# Patient Record
Sex: Female | Born: 1965 | Race: Black or African American | Hispanic: No | Marital: Single | Smoking: Never smoker
Health system: Southern US, Community
[De-identification: ages and names within clinical notes are randomized; demographics above are authoritative.]

---

## 2004-11-28 ENCOUNTER — Ambulatory Visit: Payer: Self-pay | Admitting: Family Medicine

## 2004-12-19 ENCOUNTER — Ambulatory Visit: Payer: Self-pay | Admitting: Family Medicine

## 2005-02-07 ENCOUNTER — Ambulatory Visit: Payer: Self-pay | Admitting: Family Medicine

## 2005-04-10 ENCOUNTER — Ambulatory Visit: Payer: Self-pay | Admitting: Family Medicine

## 2005-08-21 ENCOUNTER — Ambulatory Visit: Payer: Self-pay | Admitting: Family Medicine

## 2006-01-04 ENCOUNTER — Encounter: Payer: Self-pay | Admitting: Family Medicine

## 2006-02-12 ENCOUNTER — Ambulatory Visit: Payer: Self-pay | Admitting: Family Medicine

## 2006-04-30 ENCOUNTER — Ambulatory Visit: Payer: Self-pay | Admitting: Family Medicine

## 2006-09-12 ENCOUNTER — Ambulatory Visit: Payer: Self-pay | Admitting: Family Medicine

## 2007-02-19 ENCOUNTER — Ambulatory Visit: Payer: Self-pay | Admitting: Family Medicine

## 2007-04-17 ENCOUNTER — Emergency Department (HOSPITAL_COMMUNITY): Admission: EM | Admit: 2007-04-17 | Discharge: 2007-04-17 | Payer: Self-pay | Admitting: Emergency Medicine

## 2007-04-19 ENCOUNTER — Encounter: Payer: Self-pay | Admitting: Family Medicine

## 2007-04-19 DIAGNOSIS — D509 Iron deficiency anemia, unspecified: Secondary | ICD-10-CM

## 2007-04-19 DIAGNOSIS — E039 Hypothyroidism, unspecified: Secondary | ICD-10-CM | POA: Insufficient documentation

## 2007-04-19 DIAGNOSIS — M1990 Unspecified osteoarthritis, unspecified site: Secondary | ICD-10-CM | POA: Insufficient documentation

## 2007-04-19 DIAGNOSIS — D568 Other thalassemias: Secondary | ICD-10-CM

## 2007-04-19 DIAGNOSIS — I10 Essential (primary) hypertension: Secondary | ICD-10-CM | POA: Insufficient documentation

## 2007-04-22 ENCOUNTER — Ambulatory Visit: Payer: Self-pay | Admitting: Family Medicine

## 2007-04-22 DIAGNOSIS — R002 Palpitations: Secondary | ICD-10-CM | POA: Insufficient documentation

## 2007-05-02 ENCOUNTER — Ambulatory Visit: Payer: Self-pay | Admitting: Unknown Physician Specialty

## 2007-09-25 ENCOUNTER — Encounter: Payer: Self-pay | Admitting: Family Medicine

## 2007-09-27 ENCOUNTER — Ambulatory Visit: Payer: Self-pay | Admitting: Family Medicine

## 2008-03-04 ENCOUNTER — Encounter: Payer: Self-pay | Admitting: Family Medicine

## 2008-05-10 ENCOUNTER — Emergency Department: Payer: Self-pay | Admitting: Emergency Medicine

## 2008-05-25 ENCOUNTER — Encounter: Payer: Self-pay | Admitting: Family Medicine

## 2008-06-05 ENCOUNTER — Encounter: Payer: Self-pay | Admitting: Family Medicine

## 2008-06-29 ENCOUNTER — Encounter: Payer: Self-pay | Admitting: Family Medicine

## 2008-07-08 ENCOUNTER — Encounter: Payer: Self-pay | Admitting: Family Medicine

## 2008-08-20 ENCOUNTER — Encounter: Payer: Self-pay | Admitting: Family Medicine

## 2008-08-25 ENCOUNTER — Encounter: Payer: Self-pay | Admitting: Family Medicine

## 2008-11-04 IMAGING — US US PELV - US TRANSVAGINAL
1 series · 17 of 25 positions shown · non-contrast
Comparison: none

REASON FOR EXAM: screening mammo  fibroids for pelvic US
COMMENTS:

[Series 1: us pelv - us transvaginal · 17 of 26 slices shown]
[im 1/26]
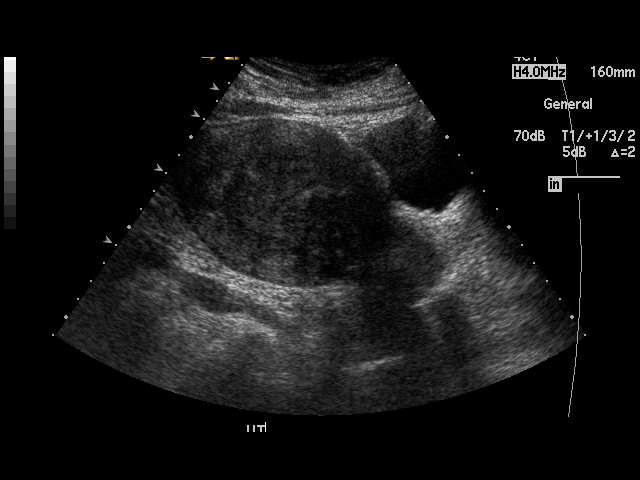
[im 3/26]
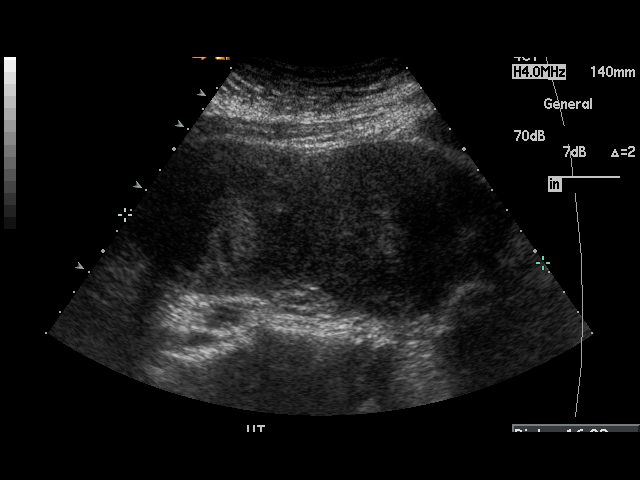
[im 4/26]
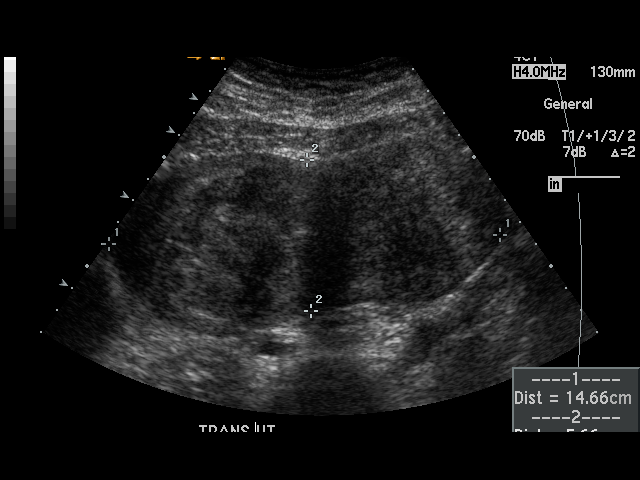
[im 6/26]
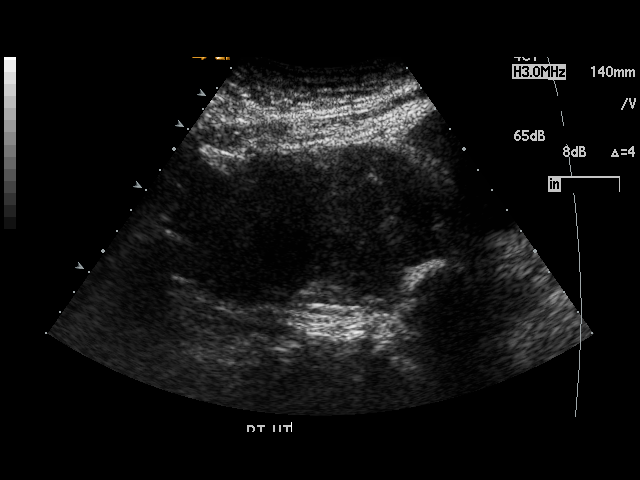
[im 7/26]
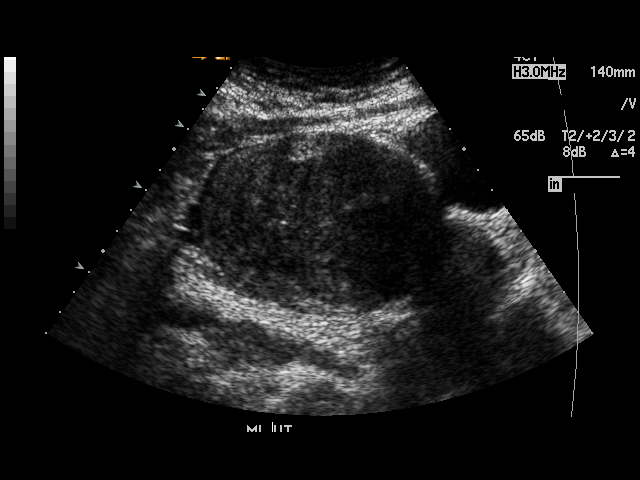
[im 9/26]
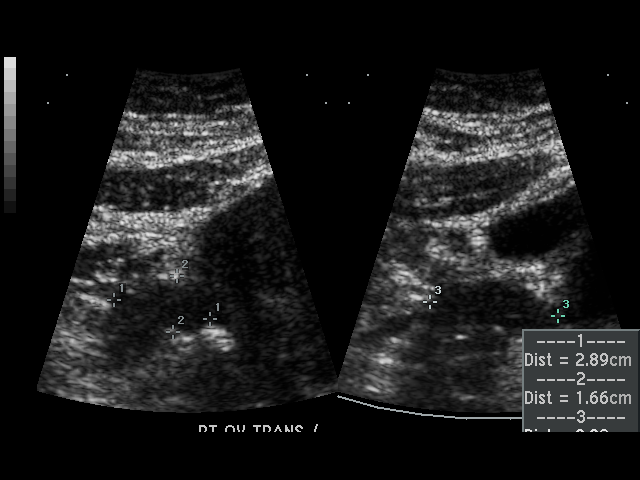
[im 10/26]
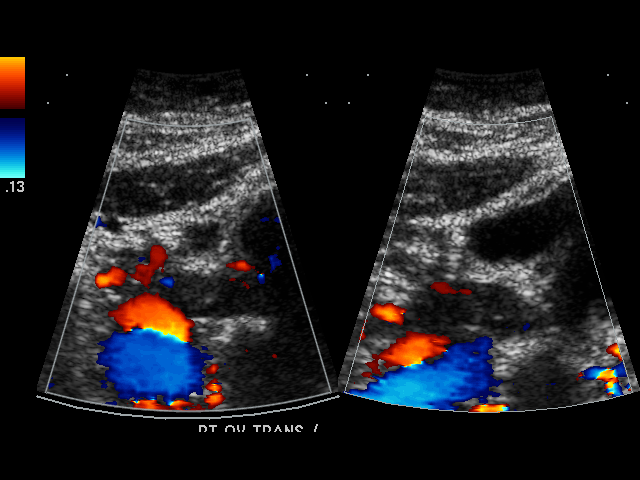
[im 12/26]
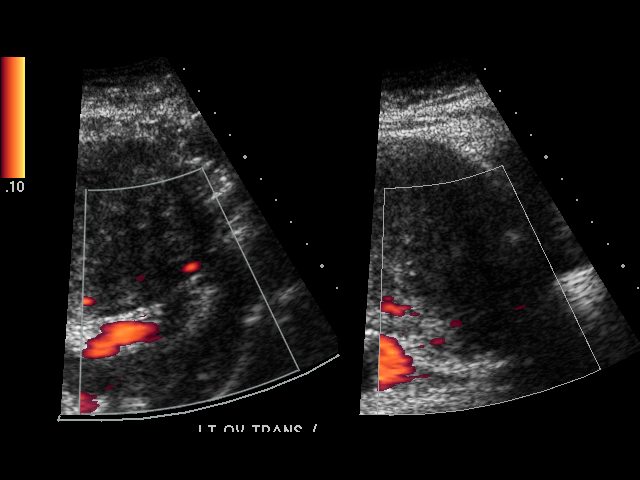
[im 13/26]
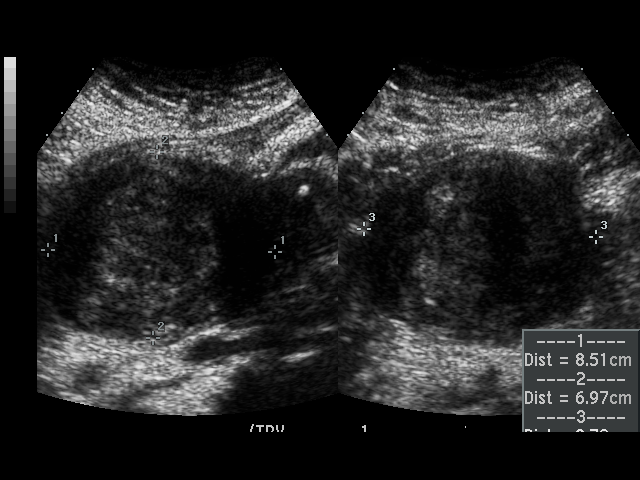
[im 14/26]
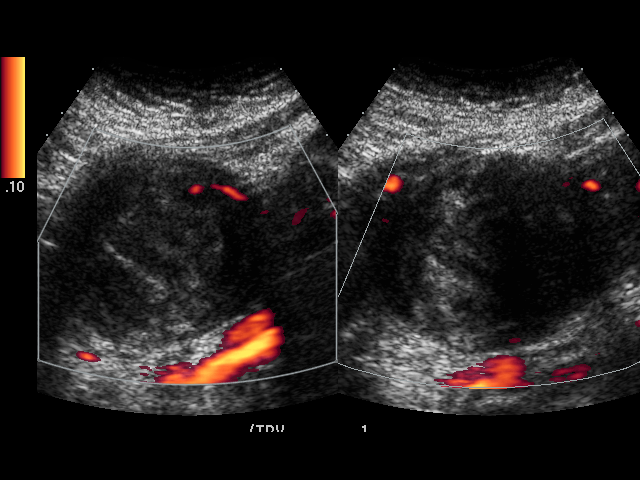
[im 16/26]
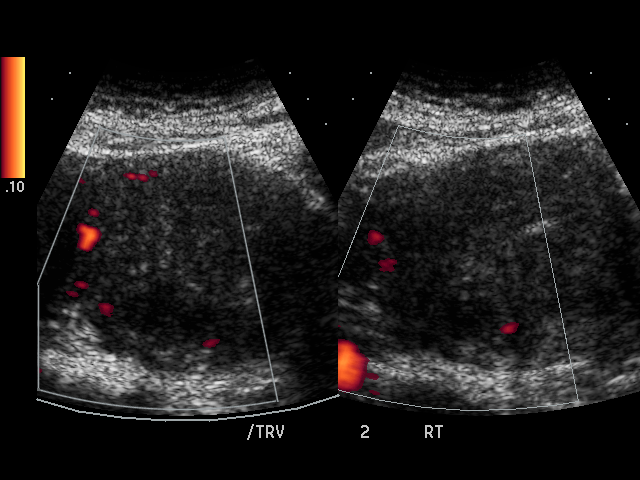
[im 17/26]
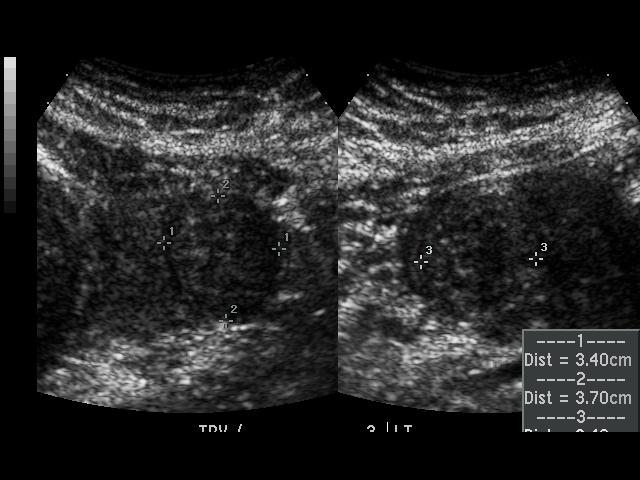
[im 19/26]
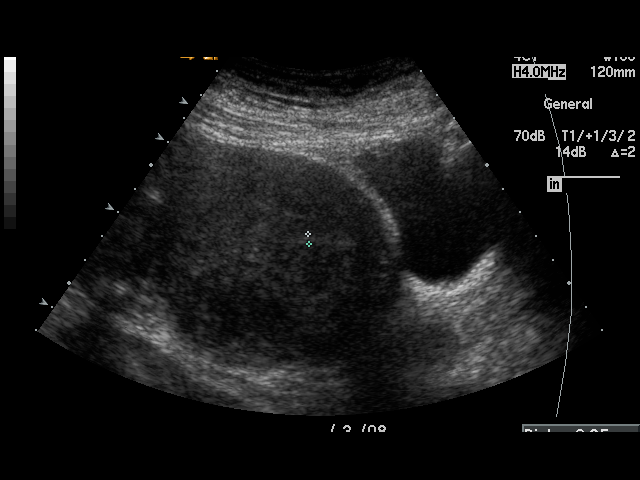
[im 20/26]
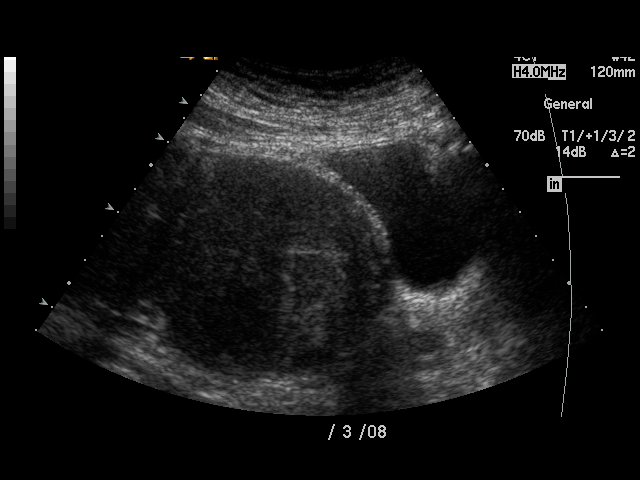
[im 22/26]
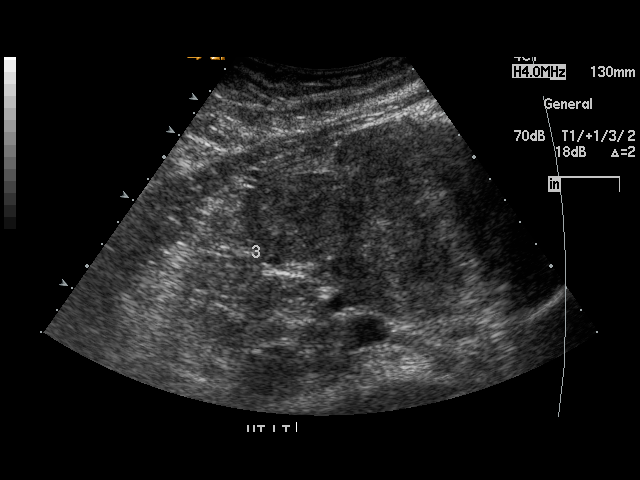
[im 23/26]
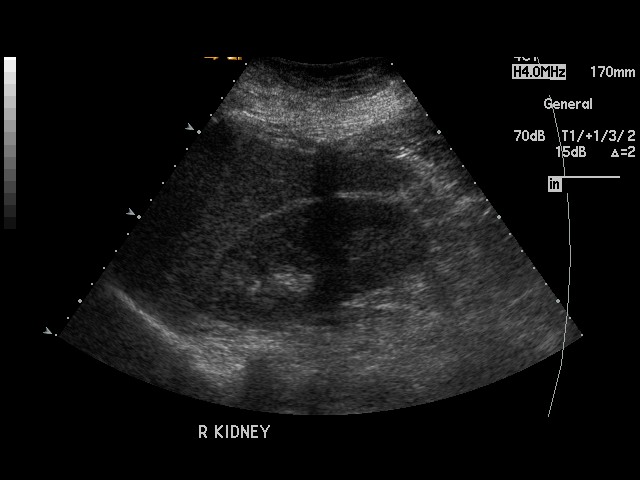
[im 26/26]
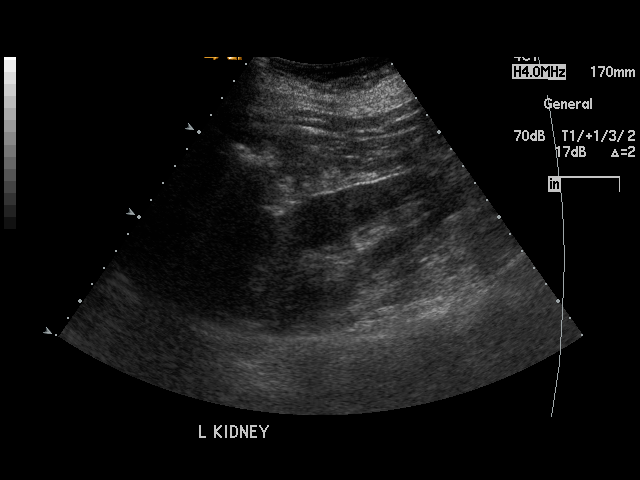

[17 of 25 positions shown; findings below may reference images not displayed]

PROCEDURE:     US  - US PELVIS MASS EXAM  - [DATE] [DATE] [DATE]  [DATE]

RESULT:     Transabdominal pelvic ultrasound was performed. The uterus
measures 16.92 cm x 14.66 cm x 5.66 cm. There are noted at least three
uterine masses consistent with uterine fibroids. These measure 8.7 cm,
cm and 3.2 cm at maximum size respectively. The largest is at the fundus and
appears to be in great part exophytic. The endometrium measures 3.5 mm in
thickness. The RIGHT and LEFT ovaries are visualized. The RIGHT ovary
measures 3.8 cm at maximum diameter and the LEFT ovary measures 3.02 cm at
maximum diameter. No abnormal adnexal masses are seen. There is no free
fluid in the pelvis. The kidneys show no hydronephrosis.
IMPRESSION: 1. The uterus is enlarged. Multiple uterine masses consistent with uterine
fibroids are observed.
2. No abnormal adnexal masses are seen.
3. There is no free fluid identified in the pelvis.

## 2008-11-24 ENCOUNTER — Ambulatory Visit: Payer: Self-pay | Admitting: Family Medicine

## 2008-11-24 DIAGNOSIS — K633 Ulcer of intestine: Secondary | ICD-10-CM

## 2008-11-25 LAB — CONVERTED CEMR LAB: TSH: 2.06 microintl units/mL (ref 0.35–5.50)

## 2008-12-24 ENCOUNTER — Telehealth: Payer: Self-pay | Admitting: Family Medicine

## 2009-02-10 ENCOUNTER — Encounter: Payer: Self-pay | Admitting: Family Medicine

## 2009-02-22 ENCOUNTER — Encounter: Payer: Self-pay | Admitting: Family Medicine

## 2009-02-25 ENCOUNTER — Encounter: Payer: Self-pay | Admitting: Family Medicine

## 2009-03-09 ENCOUNTER — Encounter: Payer: Self-pay | Admitting: Family Medicine

## 2009-09-23 ENCOUNTER — Encounter: Payer: Self-pay | Admitting: Family Medicine

## 2009-09-24 ENCOUNTER — Ambulatory Visit: Payer: Self-pay | Admitting: Family Medicine

## 2009-09-24 DIAGNOSIS — R1013 Epigastric pain: Secondary | ICD-10-CM

## 2009-09-24 LAB — CONVERTED CEMR LAB
Eosinophils Relative: 3 % (ref 0.0–5.0)
HCT: 33.2 % — ABNORMAL LOW (ref 36.0–46.0)
Hemoglobin: 10.4 g/dL — ABNORMAL LOW (ref 12.0–15.0)
Lymphs Abs: 2 10*3/uL (ref 0.7–4.0)
MCV: 69.6 fL — ABNORMAL LOW (ref 78.0–100.0)
Monocytes Absolute: 0.6 10*3/uL (ref 0.1–1.0)
Monocytes Relative: 10.5 % (ref 3.0–12.0)
Neutro Abs: 2.5 10*3/uL (ref 1.4–7.7)
RDW: 16.8 % — ABNORMAL HIGH (ref 11.5–14.6)

## 2009-10-08 ENCOUNTER — Encounter: Payer: Self-pay | Admitting: Family Medicine

## 2009-10-18 ENCOUNTER — Encounter: Payer: Self-pay | Admitting: Family Medicine

## 2009-10-25 ENCOUNTER — Encounter: Payer: Self-pay | Admitting: Family Medicine

## 2010-04-07 ENCOUNTER — Telehealth: Payer: Self-pay | Admitting: Family Medicine

## 2010-04-08 ENCOUNTER — Encounter: Payer: Self-pay | Admitting: Family Medicine

## 2010-05-17 ENCOUNTER — Encounter: Payer: Self-pay | Admitting: Family Medicine

## 2010-08-22 ENCOUNTER — Encounter: Payer: Self-pay | Admitting: Family Medicine

## 2010-12-06 NOTE — Progress Notes (Signed)
Summary: prior Berkley Harvey is needed for nexium  Phone Note From Pharmacy   Caller: [facility] [address] Summary of Call: Prior Berkley Harvey is needed for nexium, form is on your shelf. Initial call taken by: Lowella Petties CMA,  April 07, 2010 2:58 PM  Follow-up for Phone Call        form done and in nurse in box  please scan form also  Follow-up by: Judith Part MD,  April 08, 2010 8:47 AM  Additional Follow-up for Phone Call Additional follow up Details #1::        Completed form faxed to [phone] as instructed. Form on Laurie's desk and Jacki Cones said would scan when finished.Lewanda Rife LPN  April 08, 980 10:17 AM

## 2010-12-06 NOTE — Medication Information (Signed)
Summary: [facility]  Prior [facility]   Imported By: Lester [place] 04/25/2010 12:34:59  _____________________________________________________________________  External Attachment:    Type:   Image     Comment:   External Document

## 2010-12-06 NOTE — Letter (Signed)
Summary: [facility]  [facility]   Imported By: Maryln Gottron 05/24/2010 15:30:07  _____________________________________________________________________  External Attachment:    Type:   Image     Comment:   External Document

## 2010-12-08 NOTE — Letter (Signed)
Summary: [facility]   Imported By: Lanelle Bal 10/21/2010 09:24:37  _____________________________________________________________________  External Attachment:    Type:   Image     Comment:   External Document

## 2011-08-24 LAB — POCT CARDIAC MARKERS
CKMB, poc: 2.3
Myoglobin, poc: 82
Operator id: 277751
Troponin i, poc: <0.05

## 2011-08-24 LAB — I-STAT 8, (EC8 V) (CONVERTED LAB)
Acid-Base Excess: 2
Bicarbonate: 26.8 — ABNORMAL HIGH
HCT: 46
Operator id: 277751
TCO2: 28
pCO2, Ven: 39.9 — ABNORMAL LOW

## 2011-08-24 LAB — POCT I-STAT CREATININE: Creatinine, Ser: 0.8

## 2012-08-30 ENCOUNTER — Encounter: Payer: Self-pay | Admitting: *Deleted

## 2012-08-30 NOTE — Progress Notes (Signed)
Late entry 08/13/12- pt request to only have BRCA testing done.  Maylon Cos genetic counselor notified.

## 2013-06-23 ENCOUNTER — Ambulatory Visit: Payer: Self-pay | Admitting: Obstetrics and Gynecology

## 2014-07-16 ENCOUNTER — Encounter: Payer: Self-pay | Admitting: Podiatry

## 2014-07-16 ENCOUNTER — Ambulatory Visit (INDEPENDENT_AMBULATORY_CARE_PROVIDER_SITE_OTHER): Payer: BC Managed Care – PPO | Admitting: Podiatry

## 2014-07-16 VITALS — BP 115/88 | HR 72 | Resp 16 | Ht 65.0 in | Wt 173.0 lb

## 2014-07-16 DIAGNOSIS — B351 Tinea unguium: Secondary | ICD-10-CM

## 2014-07-16 MED ORDER — EFINACONAZOLE 10 % EX SOLN: 1.0000 [drp] | Freq: Every day | CUTANEOUS | Status: DC

## 2014-07-16 NOTE — Patient Instructions (Signed)

## 2014-07-16 NOTE — Progress Notes (Signed)
   Subjective:    Patient ID: Tammy Wright, female    DOB: 10/22/1966, 48 y.o.   MRN: [id number]  HPI Comments: i have a fungus on both big toes. I've had it for 3 - 4 years and is getting worse. The big toenail is coming off and its hurting. She has tried some over the counter treatment, including tea tree oil without any resolution. She denies any redness/drainage/pain from around the nails.      Review of Systems  All other systems reviewed and are negative.      Objective:   Physical Exam AAO x3, NAD DP/PT pulses palpable 2/4 b/l, CRT < 3sec Protective sensation intact the Semmes Weinstein monofilament, vibratory sensation intact, Achilles tendon reflex intact. Bilateral hallux nails hypertrophic, dystrophic, brittle, yellow-brown discoloration. The right hallux nail is loose distally but firmly adhered proximally. No surrounding erythema or drainage from the nails. No open lesions or pre-ulcerative lesions. MMT 5/5 ROM WNL No leg pain/warmth/swelling       Assessment & Plan:  48 year old female with onychomycosis. -Various treatment options discussed including alternatives, risks, complications  -Both over-the-counter versus prescription therapy was discussed. -At this time the patient elects to proceed with Jublia. Prescription was sent to [facility] in the patient was given instructions on how to followup on a prescription as well as instructions on how to apply. We discussed side effects of medication and she was directed to stop immediately if any are to occur. -Followup as needed or if there are any change in symptoms, questions or concerns.

## 2014-07-18 MED ORDER — EFINACONAZOLE 10 % EX SOLN: 1.0000 [drp] | Freq: Every day | CUTANEOUS | Status: AC

## 2023-01-19 ENCOUNTER — Ambulatory Visit (INDEPENDENT_AMBULATORY_CARE_PROVIDER_SITE_OTHER): Payer: BC Managed Care – PPO | Admitting: Podiatry

## 2023-01-19 DIAGNOSIS — M7671 Peroneal tendinitis, right leg: Secondary | ICD-10-CM

## 2023-01-19 DIAGNOSIS — Z79899 Other long term (current) drug therapy: Secondary | ICD-10-CM

## 2023-01-19 DIAGNOSIS — B351 Tinea unguium: Secondary | ICD-10-CM

## 2023-01-19 NOTE — Progress Notes (Signed)
  Subjective:  Patient ID: Tammy Wright, female    DOB: 08/17/1966,  MRN: [id number]  No chief complaint on file.   57 y.o. female presents with the above complaint.  Patient presents with left lateral foot pain.  Patient states is painful to touch is progressive gotten worse hurts with ambulation worse with pressure she has not seen anyone else prior to seeing me.  Pain scale 7 out of 10 dull achy in nature.  She has secondary complaint of onychomycosis to right hallux and second digit.  She states that she has not tried any for she has tried topical medication which has not helped.  She has never done oral medication   Review of Systems: Negative except as noted in the HPI. Denies N/V/F/Ch.  No past medical history on file.  Current Outpatient Medications:    Efinaconazole 10 % SOLN, Apply 1 drop topically daily., Disp: 4 mL, Rfl: 11  Social History   Tobacco Use  Smoking Status Never  Smokeless Tobacco Not on file    No Known Allergies Objective:  There were no vitals filed for this visit. There is no height or weight on file to calculate BMI. Constitutional Well developed. Well nourished.  Vascular Dorsalis pedis pulses palpable bilaterally. Posterior tibial pulses palpable bilaterally. Capillary refill normal to all digits.  No cyanosis or clubbing noted. Pedal hair growth normal.  Neurologic Normal speech. Oriented to person, place, and time. Epicritic sensation to light touch grossly present bilaterally.  Dermatologic Thickened elongated dystrophic mycotic nails x 2 to the right hallux and second digit No open wounds. No skin lesions.  Orthopedic: Pain along the course of the peroneal tendon.  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  No pain at the insertion of the peroneal tendon.   Radiographs: None Assessment:   1. Long term use of drug    2. Peroneal tendinitis of lower leg, right   3. Onychomycosis   4. Nail fungus    Plan:  Patient was evaluated and treated and all questions answered.  Left peroneal tendinitis -All questions and concerns were discussed with the patient extensive detail given the amount of pain that she is experiencing she will benefit from cam boot immobilization help decrease acute inflammatory component associate with pain.  Patient agrees with plan like to proceed with cam boot immobilization -Cam boot was dispensed  Onychomycosis hallux and second digit right foot -Educated the patient on the etiology of onychomycosis and various treatment options associated with improving the fungal load.  I explained to the patient that there is 3 treatment options available to treat the onychomycosis including topical, p.o., laser treatment.  Patient elected to undergo p.o. options with Lamisil/terbinafine therapy.  In order for me to start the medication therapy, I explained to the patient the importance of evaluating the liver and obtaining the liver function test.  Once the liver function test comes back normal I will start him on 4-month course of Lamisil therapy.  Patient understood all risk and would like to proceed with Lamisil therapy.  I have asked the patient to immediately stop the Lamisil therapy if she has any reactions to it and call the office or go to the emergency room right away.  Patient states understanding   No follow-ups on file.   Right second hallux LFT lamisil  Left peroneal tenditis CAM boot

## 2023-01-20 LAB — HEPATIC FUNCTION PANEL
ALT: 15 IU/L (ref 0–32)
AST: 20 IU/L (ref 0–40)
Albumin: 4.6 g/dL (ref 3.8–4.9)
Alkaline Phosphatase: 87 IU/L (ref 44–121)
Bilirubin Total: 0.6 mg/dL (ref 0.0–1.2)
Bilirubin, Direct: 0.16 mg/dL (ref 0.00–0.40)
Total Protein: 7.2 g/dL (ref 6.0–8.5)

## 2023-01-22 MED ORDER — TERBINAFINE HCL 250 MG PO TABS: 250.0000 mg | ORAL_TABLET | Freq: Every day | ORAL | 0 refills | Status: AC

## 2023-01-22 NOTE — Addendum Note (Signed)
Addended by: Boneta Lucks on: 01/22/2023 09:14 AM   Modules accepted: Orders

## 2023-02-23 ENCOUNTER — Ambulatory Visit (INDEPENDENT_AMBULATORY_CARE_PROVIDER_SITE_OTHER): Payer: BC Managed Care – PPO | Admitting: Podiatry

## 2023-02-23 DIAGNOSIS — M7672 Peroneal tendinitis, left leg: Secondary | ICD-10-CM

## 2023-02-23 DIAGNOSIS — M7671 Peroneal tendinitis, right leg: Secondary | ICD-10-CM

## 2023-02-23 NOTE — Progress Notes (Signed)
  Subjective:  Patient ID: Tammy Wright, female    DOB: 1966/09/15,  MRN: [id number]  Chief Complaint  Patient presents with   Foot Pain    57 y.o. female presents with the above complaint.  Patient presents with left lateral foot pain.  Patient states is painful to touch.  Is hurts with ambulation.  She would like to discuss treatment options for this.  She states the cam boot immobilization helped considerably her pain scale is 5 out of 10.   Review of Systems: Negative except as noted in the HPI. Denies N/V/F/Ch.  No past medical history on file.  Current Outpatient Medications:    Efinaconazole 10 % SOLN, Apply 1 drop topically daily., Disp: 4 mL, Rfl: 11   terbinafine (LAMISIL) 250 MG tablet, Take 1 tablet (250 mg total) by mouth daily., Disp: 90 tablet, Rfl: 0  Social History   Tobacco Use  Smoking Status Never  Smokeless Tobacco Not on file    No Known Allergies Objective:  There were no vitals filed for this visit. There is no height or weight on file to calculate BMI. Constitutional Well developed. Well nourished.  Vascular Dorsalis pedis pulses palpable bilaterally. Posterior tibial pulses palpable bilaterally. Capillary refill normal to all digits.  No cyanosis or clubbing noted. Pedal hair growth normal.  Neurologic Normal speech. Oriented to person, place, and time. Epicritic sensation to light touch grossly present bilaterally.  Dermatologic Thickened elongated dystrophic mycotic nails x 2 to the right hallux and second digit No open wounds. No skin lesions.  Orthopedic: No further pain along the course of the peroneal tendon.  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  No pain at the insertion of the peroneal tendon.   Radiographs: None Assessment:   1. Peroneal tendinitis of lower leg, right     Plan:  Patient was evaluated  and treated and all questions answered.  Left peroneal tendinitis -Clinically patient's pain has improved considerably.  She will begin transition away from the cam boot into a Tri-Lock ankle brace Tri-Lock ankle brace was dispensed.  If any foot and ankle issues or in the future she will come back and see me. -Will try steroid injection next time if there is no improvement  Onychomycosis hallux and second digit right foot -Educated the patient on the etiology of onychomycosis and various treatment options associated with improving the fungal load.  I explained to the patient that there is 3 treatment options available to treat the onychomycosis including topical, p.o., laser treatment.  Patient elected to undergo p.o. options with Lamisil/terbinafine therapy.  In order for me to start the medication therapy, I explained to the patient the importance of evaluating the liver and obtaining the liver function test.  Once the liver function test comes back normal I will start him on 42-month course of Lamisil therapy.  Patient understood all risk and would like to proceed with Lamisil therapy.  I have asked the patient to immediately stop the Lamisil therapy if she has any reactions to it and call the office or go to the emergency room right away.  Patient states understanding   No follow-ups on file.

## 2023-03-15 ENCOUNTER — Encounter: Payer: Self-pay | Admitting: Podiatry

## 2023-03-15 ENCOUNTER — Ambulatory Visit (INDEPENDENT_AMBULATORY_CARE_PROVIDER_SITE_OTHER): Payer: BC Managed Care – PPO | Admitting: Podiatry

## 2023-03-15 VITALS — BP 116/74 | HR 62

## 2023-03-15 DIAGNOSIS — M7672 Peroneal tendinitis, left leg: Secondary | ICD-10-CM

## 2023-03-15 NOTE — Progress Notes (Signed)
  Subjective:  Patient ID: Tammy Wright, female    DOB: 1965-11-26,  MRN: [id number]  Chief Complaint  Patient presents with   Foot Pain    "I couldn't wear that brace.  It made it worse.  So, I been clunking around in that boot for a while."    57 y.o. female presents with the above complaint.  Patient presents with left lateral foot pain.  Patient did not help much.  She is having some pain.  Review of Systems: Negative except as noted in the HPI. Denies N/V/F/Ch.  No past medical history on file.  Current Outpatient Medications:    terbinafine (LAMISIL) 250 MG tablet, Take 1 tablet (250 mg total) by mouth daily., Disp: 90 tablet, Rfl: 0   Efinaconazole 10 % SOLN, Apply 1 drop topically daily., Disp: 4 mL, Rfl: 11  Social History   Tobacco Use  Smoking Status Never  Smokeless Tobacco Not on file    No Known Allergies Objective:   Vitals:   03/15/23 0914  BP: 116/74  Pulse: 62   There is no height or weight on file to calculate BMI. Constitutional Well developed. Well nourished.  Vascular Dorsalis pedis pulses palpable bilaterally. Posterior tibial pulses palpable bilaterally. Capillary refill normal to all digits.  No cyanosis or clubbing noted. Pedal hair growth normal.  Neurologic Normal speech. Oriented to person, place, and time. Epicritic sensation to light touch grossly present bilaterally.  Dermatologic Thickened elongated dystrophic mycotic nails x 2 to the right hallux and second digit No open wounds. No skin lesions.  Orthopedic: pain along the course of the peroneal tendon. 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  pain at the insertion of the peroneal tendon.   Radiographs: None Assessment:   1. Peroneal tendinitis of left lower extremity      Plan:  Patient was evaluated and treated and all  questions answered.  Left peroneal tendinitis -Clinically patient's pain has improved considerably.  She will begin transition away from the cam boot into a Tri-Lock ankle brace Tri-Lock ankle brace was dispensed.  If any foot and ankle issues or in the future she will come back and see me. -Will try steroid injection next time if there is no improvement  Onychomycosis hallux and second digit right foot -Educated the patient on the etiology of onychomycosis and various treatment options associated with improving the fungal load.  I explained to the patient that there is 3 treatment options available to treat the onychomycosis including topical, p.o., laser treatment.  Patient elected to undergo p.o. options with Lamisil/terbinafine therapy.  In order for me to start the medication therapy, I explained to the patient the importance of evaluating the liver and obtaining the liver function test.  Once the liver function test comes back normal I will start him on 50-month course of Lamisil therapy.  Patient understood all risk and would like to proceed with Lamisil therapy.  I have asked the patient to immediately stop the Lamisil therapy if she has any reactions to it and call the office or go to the emergency room right away.  Patient states understanding   No follow-ups on file.

## 2023-04-19 ENCOUNTER — Ambulatory Visit: Payer: BC Managed Care – PPO | Admitting: Podiatry

## 2023-12-31 ENCOUNTER — Ambulatory Visit: Payer: BC Managed Care – PPO | Attending: Orthopedic Surgery | Admitting: Physical Therapy

## 2023-12-31 ENCOUNTER — Encounter: Payer: Self-pay | Admitting: Physical Therapy

## 2023-12-31 DIAGNOSIS — M25562 Pain in left knee: Secondary | ICD-10-CM | POA: Insufficient documentation

## 2023-12-31 NOTE — Therapy (Unsigned)
 OUTPATIENT PHYSICAL THERAPY LOWER EXTREMITY EVALUATION   Patient Name: Tammy Wright MRN: [id number] DOB:Apr 29, 1966, 58 y.o., female Today's Date: 12/31/2023  END OF SESSION:  PT End of Session - 12/31/23 1752     Visit Number 1    Number of Visits 20    Date for PT Re-Evaluation 03/10/24    Authorization Type [id number]    Authorization Time Period VL 60/56 remain    Authorization - Visit Number 1    Authorization - Number of Visits 20    Progress Note Due on Visit 10    PT Start Time 1604    PT Stop Time 1645    PT Time Calculation (min) 41 min    Activity Tolerance Patient tolerated treatment well    Behavior During Therapy [facility] for tasks assessed/performed             History reviewed. No pertinent past medical history. History reviewed. No pertinent surgical history. 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 She also reports having some pain in her right knee as well. She reports having trouble with regaining flexion/bending the knee. Pt has neuropathy which causes knee to be aggravated around 3-4 am before taking the pain medication. She says her doctor says it is probably due to the the nerve block. She reports the swelling has been  bothering her and has not gone down. She reports that she moved to the [facility] a week ago from the [place] which made her feel too hunched over and she is working on not using the cane either. Pt reports some numbness on anterior knee from the nerve block but no tingling. Pt says walking around has gone well, it has been not too painful and can usually walk about 15-20 minutes before having to sit down.  PAIN:  Are you having pain? Yes: NPRS scale: 8/10 worst, 0/10 most of the time Pain location: the top of the knee  Pain description: aching  Aggravating factors: laying on the right (not on the left), bending the knee, PT  Relieving factors: pain medication, rest   PRECAUTIONS: None  RED FLAGS: None   WEIGHT BEARING RESTRICTIONS: No  FALLS:  Has patient fallen in last 6 months? 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  Vitals: BP: 112/60  HR: 75  SpO2: 100  DIAGNOSTIC FINDINGS: None  PATIENT SURVEYS:  KOOS : 32%  COGNITION: Overall cognitive status: Within functional limits for tasks assessed     SENSATION: WFL  EDEMA:  Circumferential: R 17 in L 21 in  MUSCLE LENGTH: NT  POSTURE: No Significant postural limitations  PALPATION: No TTP   LOWER EXTREMITY ROM:  {AROM/PROM:27142} ROM Right eval Left eval  Hip flexion    Hip extension    Hip abduction    Hip adduction    Hip internal rotation    Hip external rotation    Knee flexion 107/107 61/65  Knee extension 0  0  Ankle dorsiflexion    Ankle  plantarflexion    Ankle inversion    Ankle eversion     (Blank rows = not tested)  LOWER EXTREMITY MMT:  MMT Right eval Left eval  Hip flexion 4 3+*  Hip extension    Hip abduction    Hip adduction    Hip internal rotation    Hip external rotation    Knee flexion 4+ 4+*  Knee extension 4+ 4-*  Ankle dorsiflexion    Ankle plantarflexion    Ankle inversion    Ankle eversion     (Blank rows = not tested)  FUNCTIONAL TESTS:  Timed up and go (TUG): NT 6 minute walk test: NT  GAIT: Distance walked: to be assessed visit #2 Assistive device utilized: Single point cane Level of assistance: Modified independence Comments: NT                                                                                                                                TREATMENT DATE:  12/31/23  AAROM knee flexion with band 2x5   PATIENT EDUCATION:  Education details: Form and technique for correct performance of exercise and explanation about exam findings Person educated: Patient Education method: Explanation and Verbal cues Education comprehension: verbalized understanding, returned demonstration, and verbal cues required  HOME EXERCISE PROGRAM: To be given visit #2  ASSESSMENT:  CLINICAL IMPRESSION: Patient is a 58 y.o. female who was seen today for physical therapy evaluation and treatment s/p 4 weeks left TKA. Pt shows deficits in left knee flexion ROM, left hip flexion and knee extension strength, and increase of pain. The decrease in knee flexion ROM has an empty end feel with increase of pain occurring in flexion. She also shows a decreased perception of function due to the pain limitations in her knee. Further testing will be needed to assess limitations in ambulation. Pt will benefit from further knee mobility and hip and knee strengthening exercises to return to her job and be able to perform activities that require increased knee flexion.   OBJECTIVE IMPAIRMENTS: Abnormal gait,  decreased activity tolerance, decreased balance, decreased endurance, decreased mobility, difficulty walking, decreased ROM, decreased strength, hypomobility, increased edema, and pain.   ACTIVITY LIMITATIONS: carrying, lifting, bending, sitting, standing, squatting, sleeping, stairs, transfers, bed mobility, and  locomotion level  PARTICIPATION LIMITATIONS: cleaning, laundry, driving, community activity, and occupation  PERSONAL FACTORS: Fitness, Time since onset of injury/illness/exacerbation, and 3+ comorbidities: obesity, HTN, OA  are also affecting patient's functional outcome.   REHAB POTENTIAL: Good  CLINICAL DECISION MAKING: Stable/uncomplicated  EVALUATION COMPLEXITY: Low   GOALS: Goals reviewed with patient? No  SHORT TERM GOALS: Target date: 01/14/2024  Pt will be independent with HEP in order to improve strength and decrease pain in order to improve pain-free function at home and work.  Baseline: NT Goal status: INITIAL  LONG TERM GOALS: Target date: 03/10/2024  Pt will decrease their KOOS score by at least 10 points to show significant increase in knee function.  Baseline: 32%  Goal status: INITIAL  2.  Pt will increase their left knee flexion AROM to be within 10% of the right knee to show significant improvement of knee range of motion to return to activities that require squatting and bending.  Baseline: Knee flexion R/L 107/61  Goal status: INITIAL  3.  Pt will increase their left hip flexion and knee extension strength by at least 1/3 MMT grade (4- to 4) to return to ambulating safely without an assistive device.  Baseline: Hip flexion R/L 4/3+*; Knee extension R/L 4+/4-* Goal status: INITIAL  4.  Pt will increase by at least 32m (161ft) in order to demonstrate clinically significant improvement in community ambulation. Baseline: NT Goal status: INITIAL   PLAN:  PT FREQUENCY: 2x/week  PT DURATION: 10 weeks  PLANNED INTERVENTIONS: 97110-Therapeutic  exercises, 97530- Therapeutic activity, 97112- Neuromuscular re-education, 97535- Self Care, 40981- Manual therapy, [phone]- Gait training, Patient/Family education, Balance training, Stair training, Joint mobilization, Joint manipulation, Scar mobilization, Cryotherapy, Moist heat, and Biofeedback  PLAN FOR NEXT SESSION: 6 MWT and assess gait. Review HEP from home health and make adjustments. Seated AAROM flexion. Short arc quads.    [employer], Student-PT 12/31/2023, 5:57 PM

## 2024-01-02 ENCOUNTER — Ambulatory Visit: Payer: BC Managed Care – PPO

## 2024-01-02 DIAGNOSIS — M25562 Pain in left knee: Secondary | ICD-10-CM

## 2024-01-02 NOTE — Therapy (Signed)
 OUTPATIENT PHYSICAL THERAPY TREATMENT   Patient Name: Tammy Wright MRN: [id number] DOB:1966-09-25, 58 y.o., female Today's Date: 01/02/2024  END OF SESSION:  PT End of Session - 01/02/24 1558     Visit Number 2    Number of Visits 20    Date for PT Re-Evaluation 03/10/24    Authorization Type BCBS [id number]    Authorization Time Period VL 60/56 remain    Authorization - Visit Number 1    Authorization - Number of Visits 20    Progress Note Due on Visit 10    PT Start Time 1559    PT Stop Time 1644    PT Time Calculation (min) 45 min    Activity Tolerance Patient tolerated treatment well    Behavior During Therapy [facility] for tasks assessed/performed             History reviewed. No pertinent past medical history. History reviewed. No pertinent surgical history. 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 She has started to stop using the cane as much around the house and has been able to complete her exercises.   PERTINENT HISTORY: Pt is a 58 y/o female s/p left TKA on January 27th and was seen by home health PT. She also reports having some pain in her right knee as well. She reports having trouble with regaining flexion/bending the knee. Pt has neuropathy which causes knee to be aggravated around 3-4 am before taking the pain  medication. She says her doctor says it is probably due to the the nerve block. She reports the swelling has been bothering her and has not gone down. She reports that she moved to the [facility] a week ago from the [facility] which made her feel too hunched over and she is working on not using the cane either. Pt reports some numbness on anterior knee from the nerve block but no tingling. Pt says walking around has gone well, it has been not too painful and can usually walk about 15-20 minutes before having to sit down.  PAIN:  Are you having pain? Coming in without any pain today  Yes: NPRS scale: 8/10 worst, 0/10 most of the time Pain location: the top of the knee  Pain description: aching  Aggravating factors: laying on the right (not on the left), bending the knee, PT  Relieving factors: pain medication, rest   PRECAUTIONS: None  RED FLAGS: None   WEIGHT BEARING RESTRICTIONS: No  FALLS:  Has patient fallen in last 6 months? 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  Vitals: BP: 112/60  HR: 75   SpO2: 100  DIAGNOSTIC FINDINGS: None  PATIENT SURVEYS:  KOOS : 32%  COGNITION: Overall cognitive status: Within functional limits for tasks assessed     SENSATION: WFL  EDEMA:  Circumferential: R 17 in L 21 in  MUSCLE LENGTH: NT  POSTURE: No Significant postural limitations  PALPATION: No TTP   LOWER EXTREMITY ROM:  Active ROM Right eval Left eval  Hip flexion    Hip extension    Hip abduction    Hip adduction     Hip internal rotation    Hip external rotation    Knee flexion 107/107 61/65  Knee extension 0  0  Ankle dorsiflexion    Ankle plantarflexion    Ankle inversion    Ankle eversion     (Blank rows = not tested)  LOWER EXTREMITY MMT:  MMT Right eval Left eval  Hip flexion 4 3+*  Hip extension    Hip abduction    Hip adduction    Hip internal rotation    Hip external rotation    Knee flexion 4+ 4+*  Knee extension 4+ 4-*  Ankle dorsiflexion    Ankle plantarflexion    Ankle inversion    Ankle eversion     (Blank rows = not tested)  FUNCTIONAL TESTS:  6 minute walk test: 740 ft  GAIT: Distance walked: to be assessed visit #2 Assistive device utilized: Single point cane Level of assistance: Modified independence Comments: decreased L knee flexion, increased stance time on RLE, decreased stance time on LLE, increased trunk lean to the right to clear left foot, increased knee valgus                                                                                                                               TREATMENT DATE:  01/02/24 Physical Performance 6 MWT 740 ft  TherEx Knee flexion AAROM on step 2x10 with 3 sec hold  Hamstring stretch 2x30sec  Short arc quads 1x10  Long arc quads 1x10   12/31/23  AAROM knee flexion with band 2x5   PATIENT EDUCATION:  Education details: Form and technique for correct performance of exercise and explanation about exam findings Person educated: Patient Education method: Explanation and Verbal cues Education comprehension: verbalized understanding, returned demonstration, and verbal cues required  HOME EXERCISE PROGRAM: Access Code: [email or web address] URL: [email or web address] Date: 01/02/2024 Prepared by: Consuelo Pandy  Exercises - Standing Knee Flexion Stretch on Step  - 1 x daily - 7 x weekly - 3 sets - 10 reps - Seated Hamstring Stretch  - 1 x daily - 7 x weekly - 3 sets - 30 sec hold - Seated Knee Flexion AAROM   - 1 x daily - 7 x weekly - 3 sets - 10 reps -  3 sec hold - Standing March with Counter Support  - 1 x daily - 7 x weekly - 3 sets - 10 reps - Supine Heel Slide  - 1 x daily - 7 x weekly - 3 sets - 10 reps  ASSESSMENT:  CLINICAL IMPRESSION: Pt shows difficulty in walking due to a lack of L knee flexion and increased lateral trunk lean to the R in order to clear the left foot. Pt is not limited by pain with knee stretches or exercises and was able to complete them all during session. Pt shows decreased knee flexion in exercises with increased pain on lateral knee during some stretches which is consistent with findings in evaluation. Pt will benefit from further PT to treat these deficits and return to being able to bend and lift her leg to return to work.    OBJECTIVE IMPAIRMENTS: Abnormal gait, decreased activity tolerance, decreased balance, decreased endurance, decreased mobility, difficulty walking, decreased ROM, decreased strength, hypomobility, increased edema, and pain.   ACTIVITY LIMITATIONS: carrying, lifting, bending, sitting, standing, squatting, sleeping, stairs, transfers, bed mobility, and locomotion level  PARTICIPATION LIMITATIONS: cleaning, laundry, driving, community activity, and occupation  PERSONAL FACTORS: Fitness, Time since onset of injury/illness/exacerbation, and 3+ comorbidities: obesity, HTN, OA  are also affecting patient's functional outcome.   REHAB POTENTIAL: Good  CLINICAL DECISION MAKING: Stable/uncomplicated  EVALUATION COMPLEXITY: Low   GOALS: Goals reviewed with patient? No  SHORT TERM GOALS: Target date: 01/14/2024  Pt will be independent with HEP in order to improve strength and decrease pain in order to improve pain-free function at home and work.  Baseline: NT Goal status: ONGOING  LONG TERM GOALS: Target date: 03/10/2024  Pt will decrease their KOOS score by at least 10 points to show significant increase in knee function.  Baseline: 32%   Goal status: ONGOING  2.  Pt will increase their left knee flexion AROM to be within 10% of the right knee to show significant improvement of knee range of motion to return to activities that require squatting and bending.  Baseline: Knee flexion R/L 107/61  Goal status: ONGOING  3.  Pt will increase their left hip flexion and knee extension strength by at least 1/3 MMT grade (4- to 4) to return to ambulating safely without an assistive device.  Baseline: Hip flexion R/L 4/3+*; Knee extension R/L 4+/4-* Goal status: ONGOING  4.  Pt will increase by at least 66m (138ft) in order to demonstrate clinically significant improvement in community ambulation. Baseline: 740 ft Goal status: ONGOING   PLAN:  PT FREQUENCY: 2x/week  PT DURATION: 10 weeks  PLANNED INTERVENTIONS: 97110-Therapeutic exercises, 97530- Therapeutic activity, 97112- Neuromuscular re-education, 97535- Self Care, 40981- Manual therapy, [phone]- Gait training, Patient/Family education, Balance training, Stair training, Joint mobilization, Joint manipulation, Scar mobilization, Cryotherapy, Moist heat, and Biofeedback  PLAN FOR NEXT SESSION: Short arc quads with red bolster instead of blue foam roller. Continue knee flexion stretches. Possibly posterior joint mobilization.   [employer], SPT [employer] DPT   [place], Student-PT 01/02/2024, 6:28 PM    7:06 PM, 01/02/24 Rosamaria Lints, PT, DPT Physical Therapist - [facility]  [phone] [facility])

## 2024-01-16 ENCOUNTER — Ambulatory Visit: Payer: Self-pay | Attending: Orthopedic Surgery | Admitting: Physical Therapy

## 2024-01-16 DIAGNOSIS — M25562 Pain in left knee: Secondary | ICD-10-CM | POA: Insufficient documentation

## 2024-01-16 NOTE — Therapy (Signed)
 OUTPATIENT PHYSICAL THERAPY TREATMENT   Patient Name: Tammy Wright MRN: [id number] DOB:August 19, 1966, 58 y.o., female Today's Date: 01/16/2024  END OF SESSION:  PT End of Session - 01/16/24 0906     Visit Number 3    Number of Visits 20    Date for PT Re-Evaluation 03/10/24    Authorization Type BCBS [id number]    Authorization Time Period VL 60/56 remain    Authorization - Visit Number 3    Authorization - Number of Visits 20    Progress Note Due on Visit 10    PT Start Time 0903    PT Stop Time 0945    PT Time Calculation (min) 42 min    Activity Tolerance Patient tolerated treatment well    Behavior During Therapy [facility] for tasks assessed/performed             No past medical history on file. No past surgical history on file. 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   PERTINENT HISTORY: Pt is a 58 y/o female s/p left TKA on January 27th and was seen by home health PT. She also reports having some pain in her right knee as well. She reports having trouble with regaining flexion/bending the knee. Pt has neuropathy which causes knee to be aggravated around 3-4 am before taking the pain medication. She says her doctor says it is probably due to the the nerve block. She reports the swelling has  been bothering her and has not gone down. She reports that she moved to the [facility] a week ago from the 2WW which made her feel too hunched over and she is working on not using the cane either. Pt reports some numbness on anterior knee from the nerve block but no tingling. Pt says walking around has gone well, it has been not too painful and can usually walk about 15-20 minutes before having to sit down.  PAIN:  Are you having pain? Coming in without any pain today  Yes: NPRS scale: 7/10 worst, 0/10 most of the time Pain location: the top of the left knee  Pain description: aching  Aggravating factors: laying on the right (not on the left), bending the knee, PT  Relieving factors: pain medication, rest   PRECAUTIONS: None  RED FLAGS: None   WEIGHT BEARING RESTRICTIONS: No  FALLS:  Has patient fallen in last 6 months? 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  Vitals: BP: 112/60  HR: 75   SpO2: 100  DIAGNOSTIC FINDINGS: None  PATIENT SURVEYS:  KOOS : 32%  COGNITION: Overall cognitive status: Within functional limits for tasks assessed     SENSATION: WFL  EDEMA:  Circumferential: R 17 in L 21 in  MUSCLE LENGTH: NT  POSTURE: No Significant postural limitations  PALPATION: No TTP   LOWER EXTREMITY ROM:  Active ROM Right eval Left eval  Hip flexion    Hip extension    Hip abduction    Hip adduction    Hip internal rotation    Hip external rotation    Knee flexion 107/107 61/65  Knee extension 0  0   Ankle dorsiflexion    Ankle plantarflexion    Ankle inversion    Ankle eversion     (Blank rows = not tested)  LOWER EXTREMITY MMT:  MMT Right eval Left eval  Hip flexion 4 3+*  Hip extension    Hip abduction    Hip adduction    Hip internal rotation    Hip external rotation    Knee flexion 4+ 4+*  Knee extension 4+ 4-*  Ankle dorsiflexion    Ankle plantarflexion    Ankle inversion    Ankle eversion     (Blank rows = not tested)  FUNCTIONAL TESTS:  6 minute walk test: 740 ft  GAIT: Distance walked: to be assessed visit #2 Assistive device utilized: Single point cane Level of assistance: Modified independence Comments: decreased L knee flexion, increased stance time on RLE, decreased stance time on LLE, increased trunk lean to the right to clear left foot, increased knee valgus                                                                                                                               TREATMENT DATE:  01/16/24: THEREX Nu-Step with seat at 9 with no resistance for 6 min  Seated Scoots for Increased Knee Flexion 10 sec  x 5  Standing Left Knee Flexion AAROM 2 x 10  Mini-Squat with BUE support 3 x 10   SELF-CARE Scare Tissue Massage with video on Med[employer]    01/02/24 Physical Performance 6 MWT 740 ft  TherEx Knee flexion AAROM on step 2x10 with 3 sec hold  Hamstring stretch 2x30sec  Short arc quads 1x10  Long arc quads 1x10   12/31/23  AAROM knee flexion with band 2x5   PATIENT EDUCATION:  Education details: Form and technique for correct performance of exercise and explanation about exam findings Person educated: Patient Education method: Explanation and Verbal cues Education comprehension: verbalized understanding, returned demonstration, and verbal cues required  HOME EXERCISE PROGRAM: Access Code: [email or web address] URL: [email or web address] Date: 01/16/2024 Prepared by: Ellin Goodie  Exercises - Seated Hamstring Stretch   - 1 x daily - 7 x weekly - 3 sets - 30 sec hold - Standing Knee  Flexion Stretch on Step  - 1 x daily - 7 x weekly - 2 sets - 10 reps - 5 sec  hold - Seated Knee Flexion Stretch  - 1 x daily - 7 x weekly - 10 reps - 10 sec  hold - Mini Squat with Counter Support  - 3-4 x weekly - 3 sets - 10 reps  Patient Education - Scar Massage  ASSESSMENT:  CLINICAL IMPRESSION: Pt demonstrates ability to complete all knee mobility exercises without an increase in her left knee pain and without compensation.  She does experience increased knee pain near 80 degrees knee flexion limiting her ability to reach 90 degrees. PT educated pt on scar tissue for improved knee mobility and to desensitize incision site. She will benefit from further PT to treat these deficits and return to being able to bend and lift her leg to return to work.  OBJECTIVE IMPAIRMENTS: Abnormal gait, decreased activity tolerance, decreased balance, decreased endurance, decreased mobility, difficulty walking, decreased ROM, decreased strength, hypomobility, increased edema, and pain.   ACTIVITY LIMITATIONS: carrying, lifting, bending, sitting, standing, squatting, sleeping, stairs, transfers, bed mobility, and locomotion level  PARTICIPATION LIMITATIONS: cleaning, laundry, driving, community activity, and occupation  PERSONAL FACTORS: Fitness, Time since onset of injury/illness/exacerbation, and 3+ comorbidities: obesity, HTN, OA  are also affecting patient's functional outcome.   REHAB POTENTIAL: Good  CLINICAL DECISION MAKING: Stable/uncomplicated  EVALUATION COMPLEXITY: Low   GOALS: Goals reviewed with patient? No  SHORT TERM GOALS: Target date: 01/14/2024  Pt will be independent with HEP in order to improve strength and decrease pain in order to improve pain-free function at home and work.  Baseline: NT 01/16/24: Able to perform independently  Goal status: ACHIEVED   LONG TERM GOALS: Target date: 03/10/2024  Pt will decrease  their KOOS score by at least 10 points to show significant increase in knee function.  Baseline: 32%  Goal status: ONGOING  2.  Pt will increase their left knee flexion AROM to be within 10% of the right knee to show significant improvement of knee range of motion to return to activities that require squatting and bending.  Baseline: Knee flexion R/L 107/61  Goal status: ONGOING  3.  Pt will increase their left hip flexion and knee extension strength by at least 1/3 MMT grade (4- to 4) to return to ambulating safely without an assistive device.  Baseline: Hip flexion R/L 4/3+*; Knee extension R/L 4+/4-* Goal status: ONGOING  4.  Pt will increase by at least 51m (11ft) in order to demonstrate clinically significant improvement in community ambulation. Baseline: 740 ft Goal status: ONGOING   PLAN:  PT FREQUENCY: 2x/week  PT DURATION: 10 weeks  PLANNED INTERVENTIONS: 97110-Therapeutic exercises, 97530- Therapeutic activity, 97112- Neuromuscular re-education, 97535- Self Care, 82956- Manual therapy, [phone]- Gait training, Patient/Family education, Balance training, Stair training, Joint mobilization, Joint manipulation, Scar mobilization, Cryotherapy, Moist heat, and Biofeedback  PLAN FOR NEXT SESSION: TRX squats for increased knee flexion and supine knee flexion wall walk AROM. Possibly posterior joint mobilization.   Ellin Goodie PT, DPT  [facility] [address], [place], [address] Phone: [phone]   Fax:  [phone]

## 2024-01-21 ENCOUNTER — Ambulatory Visit: Admitting: Physical Therapy

## 2024-01-21 DIAGNOSIS — M25562 Pain in left knee: Secondary | ICD-10-CM

## 2024-01-21 NOTE — Therapy (Signed)
 OUTPATIENT PHYSICAL THERAPY TREATMENT   Patient Name: Tammy Wright MRN: [id number] DOB:21-Dec-1965, 58 y.o., female Today's Date: 01/21/2024  END OF SESSION:  PT End of Session - 01/21/24 0959     Visit Number 4    Number of Visits 20    Date for PT Re-Evaluation 03/10/24    Authorization Type [id number]    Authorization Time Period VL 60/56 remain    Authorization - Visit Number 4    Authorization - Number of Visits 20    Progress Note Due on Visit 10    PT Start Time 0950    PT Stop Time 1030    PT Time Calculation (min) 40 min    Activity Tolerance Patient tolerated treatment well    Behavior During Therapy [facility] for tasks assessed/performed             No past medical history on file. No past surgical history on file. 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 She was able to do the scar tissue massage and she only felt one area that was sensitive to the touch.   PERTINENT HISTORY: Pt is a 58 y/o female s/p left TKA on January 27th and was seen by home health PT. She also reports having some pain in her right knee as well. She reports having trouble with regaining flexion/bending the knee. Pt has neuropathy which causes knee to be aggravated around 3-4 am before taking the pain medication. She says her doctor says  it is probably due to the the nerve block. She reports the swelling has been bothering her and has not gone down. She reports that she moved to the [facility] a week ago from the 2WW which made her feel too hunched over and she is working on not using the cane either. Pt reports some numbness on anterior knee from the nerve block but no tingling. Pt says walking around has gone well, it has been not too painful and can usually walk about 15-20 minutes before having to sit down.  PAIN:  Are you having pain? Coming in without any pain today  Yes: NPRS scale: 7/10 worst, 0/10 most of the time Pain location: the top of the left knee  Pain description: aching  Aggravating factors: laying on the right (not on the left), bending the knee, PT  Relieving factors: pain medication, rest   PRECAUTIONS: None  RED FLAGS: None   WEIGHT BEARING RESTRICTIONS: No  FALLS:  Has patient fallen in last 6 months? 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  Vitals: BP: 112/60  HR: 75   SpO2: 100  DIAGNOSTIC FINDINGS: None  PATIENT SURVEYS:  KOOS : 32%  COGNITION: Overall cognitive status: Within functional limits for tasks assessed     SENSATION: WFL  EDEMA:  Circumferential: R 17 in L 21 in  MUSCLE LENGTH: NT  POSTURE: No Significant postural limitations  PALPATION: No TTP   LOWER EXTREMITY ROM:  Active ROM Right eval Left eval  Hip flexion    Hip extension    Hip abduction    Hip adduction    Hip internal rotation    Hip  external rotation    Knee flexion 107/107 61/65  Knee extension 0  0  Ankle dorsiflexion    Ankle plantarflexion    Ankle inversion    Ankle eversion     (Blank rows = not tested)  LOWER EXTREMITY MMT:  MMT Right eval Left eval  Hip flexion 4 3+*  Hip extension    Hip abduction    Hip adduction    Hip internal rotation    Hip external rotation    Knee flexion 4+ 4+*  Knee extension 4+ 4-*  Ankle dorsiflexion    Ankle plantarflexion    Ankle inversion    Ankle eversion     (Blank rows = not tested)  FUNCTIONAL TESTS:  6 minute walk test: 740 ft  GAIT: Distance walked: to be assessed visit #2 Assistive device utilized: Single point cane Level of assistance: Modified independence Comments: decreased L knee flexion, increased stance time on RLE, decreased stance time on LLE, increased trunk lean to the right to clear left foot, increased knee valgus                                                                                                                               TREATMENT DATE:  01/21/24: THEREX Nu-Step with seat and arms at 9 with no resistance for 6 min  Seated Knee Flexion Scoots on LLE 1 x 10  -Pt reports increased pain in LLE  Seated Knee Flexion AAROM in LLE 1 x 10 with 3 sec hold   THERAC  TRX squats 1 x 10  TRX squats with chair behind for increased knee flexion and visual cue  2 x 10  -min VC to bring heels back for increased knee flexion  Step Up on LLE with BUE support on 4 inch step 1 x 10  Step Up on LLE with BUE support on 6 inch step 1 X 10  Step Up on LLE with 1 UE support on 6 inch step 1 x 10  Step Up on LLE with BUE support on 8 inch step 1 x 10   01/16/24: THEREX Nu-Step with seat at 9 with no resistance for 6 min  Seated Scoots  for Increased Knee Flexion 10 sec  x 5  Standing Left Knee Flexion AAROM 2 x 10  Mini-Squat with BUE support 3 x 10   SELF-CARE Scare Tissue Massage with video on Medbridge    01/02/24 Physical  Performance 6 MWT 740 ft  TherEx Knee flexion AAROM on step 2x10 with 3 sec hold  Hamstring stretch 2x30sec  Short arc quads 1x10  Long arc quads 1x10   12/31/23  AAROM knee flexion with band 2x5   PATIENT EDUCATION:  Education details: Form and technique for correct performance of exercise and explanation about exam findings Person educated: Patient Education method: Explanation and Verbal cues Education comprehension: verbalized understanding, returned demonstration, and verbal cues required  HOME EXERCISE PROGRAM: Access Code: [email or web address] URL: [email or web address] Date: 01/21/2024 Prepared by: Ellin Goodie  Exercises - Seated Hamstring Stretch  - 1 x daily - 7 x weekly - 3 sets - 30 sec hold - Seated Knee Flexion AAROM  - 1 x daily - 2 sets - 10 reps - 3 sec   hold - Standing Knee Flexion Stretch on Step  - 1 x daily - 7 x weekly - 2 sets - 10 reps - 5 sec  hold - Seated Knee Flexion Stretch  - 1 x daily - 7 x weekly - 10 reps - 10 sec  hold - Mini Squat with Counter Support  - 3-4 x weekly - 3 sets - 10 reps - Forward Step Up with Counter Support  - 3-4 x weekly - 3 sets - 10 reps  Patient Education - Scar Massage  ASSESSMENT:  CLINICAL IMPRESSION:  Pt shows improvement in left knee flexion ROM with use of non-weight bearing positions and ice for analgesic effect with ability to reach 90 deg flexion. She was not limited by pain with strengthening exercises. She will benefit from further PT to treat these deficits and return to being able to bend and lift her leg to return to work.  OBJECTIVE IMPAIRMENTS: Abnormal gait, decreased activity tolerance, decreased balance, decreased endurance, decreased mobility, difficulty walking, decreased ROM, decreased strength, hypomobility, increased edema, and pain.   ACTIVITY LIMITATIONS: carrying, lifting, bending, sitting, standing, squatting, sleeping, stairs, transfers, bed mobility, and locomotion  level  PARTICIPATION LIMITATIONS: cleaning, laundry, driving, community activity, and occupation  PERSONAL FACTORS: Fitness, Time since onset of injury/illness/exacerbation, and 3+ comorbidities: obesity, HTN, OA  are also affecting patient's functional outcome.   REHAB POTENTIAL: Good  CLINICAL DECISION MAKING: Stable/uncomplicated  EVALUATION COMPLEXITY: Low   GOALS: Goals reviewed with patient? No  SHORT TERM GOALS: Target date: 01/14/2024  Pt will be independent with HEP in order to improve strength and decrease pain in order to improve pain-free function at home and work.  Baseline: NT 01/16/24: Able to perform independently  Goal status: ACHIEVED   LONG TERM GOALS: Target date: 03/10/2024  Pt will decrease their KOOS score by at least 10 points to show significant increase in knee function.  Baseline: 32%  Goal status: ONGOING  2.  Pt will increase their left knee flexion AROM to be within 10% of the right knee to show significant improvement of knee range of motion to return to activities that require squatting and bending.  Baseline: Knee flexion R/L 107/61  Goal status: ONGOING  3.  Pt will increase their left hip flexion and knee extension strength by at least 1/3 MMT grade (4- to 4) to return to ambulating safely without an assistive device.  Baseline: Hip flexion R/L 4/3+*; Knee extension  R/L 4+/4-* Goal status: ONGOING  4.  Pt will increase by at least 78m (123ft) in order to demonstrate clinically significant improvement in community ambulation. Baseline: 740 ft Goal status: ONGOING   PLAN:  PT FREQUENCY: 2x/week  PT DURATION: 10 weeks  PLANNED INTERVENTIONS: 97110-Therapeutic exercises, 97530- Therapeutic activity, 97112- Neuromuscular re-education, 97535- Self Care, 16109- Manual therapy, [phone]- Gait training, Patient/Family education, Balance training, Stair training, Joint mobilization, Joint manipulation, Scar mobilization, Cryotherapy, Moist heat,  and Biofeedback  PLAN FOR NEXT SESSION: TENS with exercises especially knee flexion exercises.  Possibly posterior joint mobilization.   Ellin Goodie PT, DPT  [facility] [address], [place], [address] Phone: [phone]   Fax:  [phone]

## 2024-01-23 ENCOUNTER — Ambulatory Visit: Admitting: Physical Therapy

## 2024-01-23 DIAGNOSIS — M25562 Pain in left knee: Secondary | ICD-10-CM | POA: Diagnosis not present

## 2024-01-23 NOTE — Therapy (Signed)
 OUTPATIENT PHYSICAL THERAPY TREATMENT   Patient Name: Tammy Wright MRN: [id number] DOB:11/08/1965, 58 y.o., female Today's Date: 01/23/2024  END OF SESSION:  PT End of Session - 01/23/24 0828     Visit Number 5    Number of Visits 20    Date for PT Re-Evaluation 03/10/24    Authorization Type BCBS [id number]    Authorization Time Period VL 60/55 remain    Authorization - Visit Number 5    Authorization - Number of Visits 20    Progress Note Due on Visit 10    PT Start Time 0820    PT Stop Time 0900    PT Time Calculation (min) 40 min    Activity Tolerance Patient tolerated treatment well    Behavior During Therapy [facility] for tasks assessed/performed             No past medical history on file. No past surgical history on file. 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    PERTINENT HISTORY: Pt is a 58 y/o female s/p left TKA on January 27th and was seen by home health PT. She also reports having some pain in her right knee as well. She reports having trouble with regaining flexion/bending the knee. Pt has neuropathy which causes knee to be aggravated around 3-4 am before taking the pain medication. She says her doctor says it is probably due to the the nerve  block. She reports the swelling has been bothering her and has not gone down. She reports that she moved to the [facility] a week ago from the 2WW which made her feel too hunched over and she is working on not using the cane either. Pt reports some numbness on anterior knee from the nerve block but no tingling. Pt says walking around has gone well, it has been not too painful and can usually walk about 15-20 minutes before having to sit down.  PAIN:  Are you having pain? Coming in without any pain today  Yes: NPRS scale: 7/10 worst, 0/10 most of the time Pain location: the top of the left knee  Pain description: aching  Aggravating factors: laying on the right (not on the left), bending the knee, PT  Relieving factors: pain medication, rest   PRECAUTIONS: None  RED FLAGS: None   WEIGHT BEARING RESTRICTIONS: No  FALLS:  Has patient fallen in last 6 months? 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  Vitals: BP: 112/60  HR: 75   SpO2: 100  DIAGNOSTIC FINDINGS: None  PATIENT SURVEYS:  KOOS : 32%  COGNITION: Overall cognitive status: Within functional limits for tasks assessed     SENSATION: WFL  EDEMA:  Circumferential: R 17 in L 21 in  MUSCLE LENGTH: NT  POSTURE: No Significant postural limitations  PALPATION: No TTP   LOWER EXTREMITY ROM:  Active ROM Right eval Left eval  Hip flexion    Hip extension    Hip abduction    Hip adduction    Hip internal rotation    Hip external rotation    Knee flexion  107/107 61/65  Knee extension 0  0  Ankle dorsiflexion    Ankle plantarflexion    Ankle inversion    Ankle eversion     (Blank rows = not tested)  LOWER EXTREMITY MMT:  MMT Right eval Left eval  Hip flexion 4 3+*  Hip extension    Hip abduction    Hip adduction    Hip internal rotation    Hip external rotation    Knee flexion 4+ 4+*  Knee extension 4+ 4-*  Ankle dorsiflexion    Ankle plantarflexion    Ankle inversion    Ankle eversion     (Blank rows = not tested)  FUNCTIONAL TESTS:  6 minute walk test: 740 ft  GAIT: Distance walked: to be assessed visit #2 Assistive device utilized: Single point cane Level of assistance: Modified independence Comments: decreased L knee flexion, increased stance time on RLE, decreased stance time on LLE, increased trunk lean to the right to clear left foot, increased knee valgus                                                                                                                               TREATMENT DATE:   01/23/24:  SELF CARE  TENS with inferential at 15 ma CC with box setup for 10 min  -Box setup with pads   -Pt reports about same amount of knee pain with knee flexion   THERAPEUTIC ACTIVITY  Mini-Squat with BUE support 1 x 10  Step Up on LLE on 4 inch step 1 x 10 with BUE support   Step Up on LLE on 6 inch step 1 x 10 with BUE support  Step Up on LLE on 12 inch step 1 x 10 with BUE support     01/21/24: THEREX Nu-Step with seat and arms at 9 with no resistance for 6 min  Seated Knee Flexion Scoots on LLE 1 x 10  -Pt reports increased pain in LLE  Seated Knee Flexion AAROM in LLE 1 x 10 with 3 sec hold   THERAC  TRX squats 1 x 10  TRX squats with chair behind for increased knee flexion and visual cue  2 x 10  -  min VC to bring heels back for increased knee flexion  Step Up on LLE with BUE support on 4 inch step 1 x 10  Step Up on LLE with BUE support on 6 inch step 1 X 10  Step Up on LLE with 1 UE support  on 6 inch step 1 x 10  Step Up on LLE with BUE support on 8 inch step 1 x 10   01/16/24: THEREX Nu-Step with seat at 9 with no resistance for 6 min  Seated Scoots for Increased Knee Flexion 10 sec  x 5  Standing Left Knee Flexion AAROM 2 x 10  Mini-Squat with BUE support 3 x 10   SELF-CARE Scare Tissue Massage with video on Medbridge    PATIENT EDUCATION:  Education details: Form and technique for correct performance of exercise and explanation about exam findings Person educated: Patient Education method: Explanation and Verbal cues Education comprehension: verbalized understanding, returned demonstration, and verbal cues required  HOME EXERCISE PROGRAM: Access Code: [email or web address] URL: [email or web address] Date: 01/21/2024 Prepared by: Ellin Goodie  Exercises - Seated Hamstring Stretch  - 1 x daily - 7 x weekly - 3 sets - 30 sec hold - Seated Knee Flexion AAROM  - 1 x daily - 2 sets - 10 reps - 3 sec   hold - Standing Knee Flexion Stretch on Step  - 1 x daily - 7 x weekly - 2 sets - 10 reps - 5 sec  hold - Seated Knee Flexion Stretch  - 1 x daily - 7 x weekly - 10 reps - 10 sec  hold - Mini Squat with Counter Support  - 3-4 x weekly - 3 sets - 10 reps - Forward Step Up with Counter Support  - 3-4 x weekly - 3 sets - 10 reps  Patient Education - Scar Massage  ASSESSMENT:  CLINICAL IMPRESSION: Pt demonstrates improvement in left knee strength with ability to perform step ups from an increased height. TENS did not appear to make a significant difference in reducing her left knee pain with exercises. She will benefit from further PT to treat these deficits and return to being able to bend and lift her leg to return to work.  OBJECTIVE IMPAIRMENTS: Abnormal gait, decreased activity tolerance, decreased balance, decreased endurance, decreased mobility, difficulty walking, decreased ROM, decreased strength, hypomobility, increased edema, and pain.   ACTIVITY  LIMITATIONS: carrying, lifting, bending, sitting, standing, squatting, sleeping, stairs, transfers, bed mobility, and locomotion level  PARTICIPATION LIMITATIONS: cleaning, laundry, driving, community activity, and occupation  PERSONAL FACTORS: Fitness, Time since onset of injury/illness/exacerbation, and 3+ comorbidities: obesity, HTN, OA  are also affecting patient's functional outcome.   REHAB POTENTIAL: Good  CLINICAL DECISION MAKING: Stable/uncomplicated  EVALUATION COMPLEXITY: Low   GOALS: Goals reviewed with patient? No  SHORT TERM GOALS: Target date: 01/14/2024  Pt will be independent with HEP in order to improve strength and decrease pain in order to improve pain-free function at home and work.  Baseline: NT 01/16/24: Able to perform independently  Goal status: ACHIEVED   LONG TERM GOALS: Target date: 03/10/2024  Pt will decrease their KOOS score by at least 10 points to show significant increase in knee function.  Baseline: 32%  Goal status: ONGOING  2.  Pt will increase their left knee flexion AROM to be within 10% of the right knee to show significant improvement of knee range of motion to return to activities that require squatting and bending.  Baseline: Knee flexion R/L 107/61  Goal  status: ONGOING  3.  Pt will increase their left hip flexion and knee extension strength by at least 1/3 MMT grade (4- to 4) to return to ambulating safely without an assistive device.  Baseline: Hip flexion R/L 4/3+*; Knee extension R/L 4+/4-* Goal status: ONGOING  4.  Pt will increase by at least 11m (125ft) in order to demonstrate clinically significant improvement in community ambulation. Baseline: 740 ft Goal status: ONGOING   PLAN:  PT FREQUENCY: 2x/week  PT DURATION: 10 weeks  PLANNED INTERVENTIONS: 97110-Therapeutic exercises, 97530- Therapeutic activity, 97112- Neuromuscular re-education, 97535- Self Care, 40981- Manual therapy, [phone]- Gait training, Patient/Family  education, Balance training, Stair training, Joint mobilization, Joint manipulation, Scar mobilization, Cryotherapy, Moist heat, and Biofeedback  PLAN FOR NEXT SESSION: Reassess goals.  Ongoing progression of step ups and OMEGA leg press and knee extension and hamstring curl.   Ellin Goodie PT, DPT  [facility] [address] [place], [address] Phone: [phone]   Fax:  [phone]

## 2024-01-29 ENCOUNTER — Ambulatory Visit: Payer: Self-pay | Admitting: Physical Therapy

## 2024-01-29 DIAGNOSIS — M25562 Pain in left knee: Secondary | ICD-10-CM | POA: Diagnosis not present

## 2024-01-29 NOTE — Therapy (Signed)
 OUTPATIENT PHYSICAL THERAPY TREATMENT   Patient Name: Tammy Wright MRN: [id number] DOB:[birth year], 58 y.o., female Today's Date: 01/29/2024  END OF SESSION:  PT End of Session - 01/29/24 1155     Visit Number 6    Number of Visits 20    Date for PT Re-Evaluation 03/10/24    Authorization Type [id number]    Authorization Time Period VL 60/55 remain    Authorization - Visit Number 6    Authorization - Number of Visits 20    Progress Note Due on Visit 10    PT Start Time 0900    PT Stop Time 0945    PT Time Calculation (min) 45 min    Activity Tolerance Patient tolerated treatment well    Behavior During Therapy [facility] for tasks assessed/performed              No past medical history on file. No past surgical history on file. 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   PERTINENT HISTORY: Pt is a 58 y/o female s/p left TKA on January 27th and was seen by home health PT. She also reports having some pain in her right knee as well. She reports having trouble with regaining flexion/bending the knee. Pt has neuropathy which causes knee to be aggravated around 3-4 am before taking the pain  medication. She says her doctor says it is probably due to the the nerve block. She reports the swelling has been bothering her and has not gone down. She reports that she moved to the [facility] a week ago from the 2WW which made her feel too hunched over and she is working on not using the cane either. Pt reports some numbness on anterior knee from the nerve block but no tingling. Pt says walking around has gone well, it has been not too painful and can usually walk about 15-20 minutes before having to sit down.  PAIN:  Are you having pain? Coming in without any pain today  Yes: NPRS scale: 7/10 worst, 0/10 most of the time Pain location: the top of the left knee  Pain description: aching  Aggravating factors: laying on the right (not on the left), bending the knee, PT  Relieving factors: pain medication, rest   PRECAUTIONS: None  RED FLAGS: None   WEIGHT BEARING RESTRICTIONS: No  FALLS:  Has patient fallen in last 6 months? 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  Vitals: BP: 112/60  HR: 75   SpO2: 100  DIAGNOSTIC FINDINGS: None  PATIENT SURVEYS:  KOOS : 32%  COGNITION: Overall cognitive status: Within functional limits for tasks assessed     SENSATION: WFL  EDEMA:  Circumferential: R 17 in L 21 in  MUSCLE LENGTH: NT  POSTURE: No Significant postural limitations  PALPATION: No TTP   LOWER EXTREMITY ROM:  Active ROM Right eval Left eval  Hip flexion    Hip extension    Hip abduction    Hip adduction     Hip internal rotation    Hip external rotation    Knee flexion 107/107 61/65  Knee extension 0  0  Ankle dorsiflexion    Ankle plantarflexion    Ankle inversion    Ankle eversion     (Blank rows = not tested)  LOWER EXTREMITY MMT:  MMT Right eval Left eval  Hip flexion 4 3+*  Hip extension    Hip abduction    Hip adduction    Hip internal rotation    Hip external rotation    Knee flexion 4+ 4+*  Knee extension 4+ 4-*  Ankle dorsiflexion    Ankle plantarflexion    Ankle inversion    Ankle eversion     (Blank rows = not tested)  FUNCTIONAL TESTS:  6 minute walk test: 740 ft  GAIT: Distance walked: to be assessed visit #2 Assistive device utilized: Single point cane Level of assistance: Modified independence Comments: decreased L knee flexion, increased stance time on RLE, decreased stance time on LLE, increased trunk lean to the right to clear left foot, increased knee valgus                                                                                                                               TREATMENT DATE:   01/29/24: THEREX  Nu-Step seat and arms at 9 with resistance at 3  for 5 min  R knee flex AROM 83 Fibularis longus stretch at wall 2 x 30 sec  Reviewed HEP with emphasis on knee flexion exercises  KOOS Score: 64%    Trigger Point Dry Needling  Initial Treatment: Pt instructed on Dry Needling rational, procedures, and possible side effects. Pt instructed to expect mild to moderate muscle soreness later in the day and/or into the next day.  Pt instructed in methods to reduce muscle soreness. Pt instructed to continue prescribed HEP. Patient was educated on signs and symptoms of infection and other risk factors and advised to seek medical attention should they occur.  Patient verbalized understanding of these instructions and education.   Patient Verbal Consent Given: Yes Education Handout Provided: Yes Muscles Treated: Left fibularis longus  Electrical Stimulation Performed: No Treatment Response/Outcome: No twitches    01/23/24:  SELF CARE  TENS with inferential at 15 ma CC with box setup for 10 min  -Box setup with pads   -Pt reports about same amount of knee pain with knee flexion   THERAPEUTIC ACTIVITY  Mini-Squat with BUE support 1 x 10  Step Up on LLE on 4 inch step 1 x 10 with BUE support   Step Up on LLE on 6 inch step 1 x 10 with BUE support  Step Up on LLE on 12 inch step 1 x 10 with BUE support     01/21/24: THEREX Nu-Step with seat and arms at 9 with no resistance for 6 min  Seated Knee Flexion Scoots on LLE 1 x 10  -Pt reports increased pain in LLE  Seated Knee Flexion AAROM in LLE 1 x 10 with 3 sec hold   THERAC  TRX squats 1 x 10  TRX squats with chair behind for increased knee flexion and visual cue  2 x 10  -min VC to bring heels back for increased knee flexion  Step Up on LLE with BUE support on 4 inch step 1 x 10  Step Up on LLE with BUE support on 6 inch step 1 X 10  Step Up on LLE with 1 UE support on 6 inch step 1 x 10  Step Up on LLE with BUE support on 8 inch step 1 x 10   01/16/24: THEREX Nu-Step with seat at 9 with no resistance for 6 min  Seated Scoots for Increased Knee Flexion 10 sec  x 5  Standing Left Knee Flexion AAROM 2 x 10  Mini-Squat with BUE support 3 x 10   SELF-CARE Scare Tissue Massage with video on Medbridge    PATIENT EDUCATION:  Education details: Form and technique for correct performance of exercise and explanation about exam findings Person educated: Patient Education method: Explanation and Verbal cues Education comprehension: verbalized understanding, returned demonstration, and verbal cues required  HOME EXERCISE PROGRAM: Access Code: [email or web address] URL: [email or web address] Date: 01/29/2024 Prepared by: Ellin Goodie  Exercises - Seated Hamstring Stretch  - 1 x daily - 7 x weekly - 3 sets - 30 sec hold - Seated Knee Flexion AAROM  - 1 x  daily - 2 sets - 10 reps - 3 sec   hold - Standing Knee Flexion Stretch on Step  - 1 x daily - 7 x weekly - 2 sets - 10 reps - 3 sec  hold - Seated Knee Flexion Stretch  - 1 x daily - 7 x weekly - 2 sets - 10 reps - 3 sec  hold - Mini Squat with Counter Support  - 3-4 x weekly - 3 sets - 10 reps - Forward Step Up with Counter Support  - 3-4 x weekly - 3 sets - 10 reps  Patient Education - Scar Massage  ASSESSMENT:  CLINICAL IMPRESSION: Pt shows progress towards goals with improvement in perception of right knee function and right knee mobility. Despite improvement in right knee mobility, she still lacks the requisite knee flexion to return to her job and she is limited by right knee pain with flexion. Pt did not respond to dry needling of self reported tenderness located over right fibularis longus. She will benefit from further PT to treat these deficits and return to being able to bend and lift her leg to return to work.   OBJECTIVE IMPAIRMENTS: Abnormal gait, decreased activity tolerance,  decreased balance, decreased endurance, decreased mobility, difficulty walking, decreased ROM, decreased strength, hypomobility, increased edema, and pain.   ACTIVITY LIMITATIONS: carrying, lifting, bending, sitting, standing, squatting, sleeping, stairs, transfers, bed mobility, and locomotion level  PARTICIPATION LIMITATIONS: cleaning, laundry, driving, community activity, and occupation  PERSONAL FACTORS: Fitness, Time since onset of injury/illness/exacerbation, and 3+ comorbidities: obesity, HTN, OA  are also affecting patient's functional outcome.   REHAB POTENTIAL: Good  CLINICAL DECISION MAKING: Stable/uncomplicated  EVALUATION COMPLEXITY: Low   GOALS: Goals reviewed with patient? No  SHORT TERM GOALS: Target date: 01/14/2024  Pt will be independent with HEP in order to improve strength and decrease pain in order to improve pain-free function at home and work.  Baseline: NT 01/16/24: Able  to perform independently  Goal status: ACHIEVED   LONG TERM GOALS: Target date: 03/10/2024  Pt will decrease their KOOS score by at least 10 points to show significant increase in knee function.  Baseline: 32% 01/29/24 64%  Goal status: ACHIEVED   2.  Pt will increase their left knee flexion AROM to be within 10% of the right knee to show significant improvement of knee range of motion to return to activities that require squatting and bending.  Baseline: Knee flexion R/L 107/61 01/29/24: Knee Flex R 83 deg  Goal status: PARTIALLY MET    3.  Pt will increase their left hip flexion and knee extension strength by at least 1/3 MMT grade (4- to 4) to return to ambulating safely without an assistive device.  Baseline: Hip flexion R/L 4/3+*; Knee extension R/L 4+/4-* Goal status: ONGOING  4.  Pt will increase by at least 70m (172ft) in order to demonstrate clinically significant improvement in community ambulation. Baseline: 740 ft Goal status: ONGOING   PLAN:  PT FREQUENCY: 2x/week  PT DURATION: 10 weeks  PLANNED INTERVENTIONS: 97110-Therapeutic exercises, 97530- Therapeutic activity, 97112- Neuromuscular re-education, 97535- Self Care, 53664- Manual therapy, [phone]- Gait training, Patient/Family education, Balance training, Stair training, Joint mobilization, Joint manipulation, Scar mobilization, Cryotherapy, Moist heat, and Biofeedback  PLAN FOR NEXT SESSION: test and knee strength test. R knee tissue massage and knee flexion mobilizations.  Ongoing progression of step ups and OMEGA leg press and knee extension and hamstring curl.   Ellin Goodie PT, DPT  [facility] [address] [place], [address] Phone: [phone]   Fax:  [phone]

## 2024-01-31 ENCOUNTER — Ambulatory Visit: Admitting: Physical Therapy

## 2024-01-31 DIAGNOSIS — M25562 Pain in left knee: Secondary | ICD-10-CM

## 2024-01-31 NOTE — Therapy (Signed)
 OUTPATIENT PHYSICAL THERAPY TREATMENT   Patient Name: Tammy Wright MRN: [id number] DOB:1966-11-06, 58 y.o., female Today's Date: 01/31/2024  END OF SESSION:  PT End of Session - 01/31/24 1353     Visit Number 7    Number of Visits 20    Date for PT Re-Evaluation 03/10/24    Authorization Type BCBS [id number]    Authorization Time Period VL 60/55 remain    Authorization - Visit Number 7    Authorization - Number of Visits 20    Progress Note Due on Visit 10    PT Start Time 1350    PT Stop Time 1430    PT Time Calculation (min) 40 min    Activity Tolerance Patient tolerated treatment well    Behavior During Therapy [facility] for tasks assessed/performed               No past medical history on file. No past surgical history on file. 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   PERTINENT HISTORY: Pt is a 58 y/o female s/p left TKA on January 27th and was seen by home health PT. She also reports having some pain in her right knee as well. She reports having trouble with regaining flexion/bending the knee. Pt has neuropathy which causes knee to be aggravated around 3-4 am before taking the pain medication. She says her doctor says it is probably due to the the nerve block. She  reports the swelling has been bothering her and has not gone down. She reports that she moved to the [facility] a week ago from the 2WW which made her feel too hunched over and she is working on not using the cane either. Pt reports some numbness on anterior knee from the nerve block but no tingling. Pt says walking around has gone well, it has been not too painful and can usually walk about 15-20 minutes before having to sit down.  PAIN:  Are you having pain? Coming in without any pain today  Yes: NPRS scale: 7/10 worst, 0/10 most of the time Pain location: the top of the left knee  Pain description: aching  Aggravating factors: laying on the right (not on the left), bending the knee, PT  Relieving factors: pain medication, rest   PRECAUTIONS: None  RED FLAGS: None   WEIGHT BEARING RESTRICTIONS: No  FALLS:  Has patient fallen in last 6 months? 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  Vitals: BP: 112/60  HR: 75   SpO2: 100  DIAGNOSTIC FINDINGS: None  PATIENT SURVEYS:  KOOS : 32%  COGNITION: Overall cognitive status: Within functional limits for tasks assessed     SENSATION: WFL  EDEMA:  Circumferential: R 17 in L 21 in  MUSCLE LENGTH: NT  POSTURE: No Significant postural limitations  PALPATION: No TTP   LOWER EXTREMITY ROM:  Active ROM Right eval Left eval  Hip flexion    Hip extension    Hip abduction    Hip adduction    Hip internal rotation    Hip external rotation    Knee flexion 107/107 61/65   Knee extension 0  0  Ankle dorsiflexion    Ankle plantarflexion    Ankle inversion    Ankle eversion     (Blank rows = not tested)  LOWER EXTREMITY MMT:  MMT Right eval Left eval  Hip flexion 4 3+*  Hip extension    Hip abduction    Hip adduction    Hip internal rotation    Hip external rotation    Knee flexion 4+ 4+*  Knee extension 4+ 4-*  Ankle dorsiflexion    Ankle plantarflexion    Ankle inversion    Ankle eversion     (Blank rows = not tested)  FUNCTIONAL TESTS:  6 minute walk test: 740 ft  GAIT: Distance walked: to be assessed visit #2 Assistive device utilized: Single point cane Level of assistance: Modified independence Comments: decreased L knee flexion, increased stance time on RLE, decreased stance time on LLE, increased trunk lean to the right to clear left foot, increased knee valgus                                                                                                                               TREATMENT DATE:   01/31/24: THEREX NU-Step seat at 7 with 2 resistance  Self Mobilizations for left knee flexion in long sitting X 30  -min VC for patient to provide downward and diagonal force Prone Quad Stretch of LLE  3 x 30 sec  Mini-Squat 2 x 10 with BUE support    MANUAL Posterior knee glide on Right knee grade IV mobs 3 x 30  Gentle left knee flexion and extension PROM  Supine left Hamstring Stretch 2 x 30 sec   01/29/24: THEREX  Nu-Step seat and arms at 9 with resistance at 3  for 5 min  R knee flex AROM 83 Fibularis longus stretch at wall 2 x 30 sec  Reviewed HEP with emphasis on knee flexion exercises  KOOS Score: 64%    Trigger Point Dry Needling  Initial Treatment: Pt instructed on Dry Needling rational, procedures, and possible side effects. Pt instructed to expect mild to moderate muscle soreness  later in the day and/or into the next day.  Pt instructed in methods to reduce muscle soreness. Pt instructed to continue  prescribed HEP. Patient was educated on signs and symptoms of infection and other risk factors and advised to seek medical attention should they occur.  Patient verbalized understanding of these instructions and education.   Patient Verbal Consent Given: Yes Education Handout Provided: Yes Muscles Treated: Left fibularis longus  Electrical Stimulation Performed: No Treatment Response/Outcome: No twitches    01/23/24:  SELF CARE  TENS with inferential at 15 ma CC with box setup for 10 min  -Box setup with pads   -Pt reports about same amount of knee pain with knee flexion   THERAPEUTIC ACTIVITY  Mini-Squat with BUE support 1 x 10  Step Up on LLE on 4 inch step 1 x 10 with BUE support   Step Up on LLE on 6 inch step 1 x 10 with BUE support  Step Up on LLE on 12 inch step 1 x 10 with BUE support     01/21/24: THEREX Nu-Step with seat and arms at 9 with no resistance for 6 min  Seated Knee Flexion Scoots on LLE 1 x 10  -Pt reports increased pain in LLE  Seated Knee Flexion AAROM in LLE 1 x 10 with 3 sec hold   THERAC  TRX squats 1 x 10  TRX squats with chair behind for increased knee flexion and visual cue  2 x 10  -min VC to bring heels back for increased knee flexion  Step Up on LLE with BUE support on 4 inch step 1 x 10  Step Up on LLE with BUE support on 6 inch step 1 X 10  Step Up on LLE with 1 UE support on 6 inch step 1 x 10  Step Up on LLE with BUE support on 8 inch step 1 x 10   01/16/24: THEREX Nu-Step with seat at 9 with no resistance for 6 min  Seated Scoots for Increased Knee Flexion 10 sec  x 5  Standing Left Knee Flexion AAROM 2 x 10  Mini-Squat with BUE support 3 x 10   SELF-CARE Scare Tissue Massage with video on Medbridge    PATIENT EDUCATION:  Education details: Form and technique for correct performance of exercise and explanation about exam findings Person educated: Patient Education method: Explanation and Verbal cues Education comprehension:  verbalized understanding, returned demonstration, and verbal cues required  HOME EXERCISE PROGRAM: Access Code: [email or web address] URL: [email or web address] Date: 01/31/2024 Prepared by: Ellin Goodie  Exercises - Prone Quad Stretch with Towel Roll and Strap  - 1 x daily - 7 x weekly - 3 reps - 30 sec hold - Seated Hamstring Stretch  - 1 x daily - 7 x weekly - 3 sets - 30 sec hold - Seated Knee Flexion AAROM  - 1 x daily - 2 sets - 10 reps - 3 sec   hold - Standing Knee Flexion Stretch on Step  - 1 x daily - 7 x weekly - 2 sets - 10 reps - 3 sec  hold - Seated Knee Flexion Stretch  - 1 x daily - 7 x weekly - 2 sets - 10 reps - 3 sec  hold - Mini Squat with Counter Support  - 3-4 x weekly - 3 sets - 10 reps - Forward Step Up with Counter Support  - 3-4 x weekly - 3 sets - 10 reps  Patient Education - Scar Massage  Patient  Education - Scar Massage  ASSESSMENT:  CLINICAL IMPRESSION: Pt demonstrates improved left knee mobility with mobilizations and AROM exercises. She was able to tolerate left knee flexion mobilizations with use of ice to dampen pain. She will benefit from further PT to treat these deficits and return to being able to bend and lift her leg to return to work.   OBJECTIVE IMPAIRMENTS: Abnormal gait, decreased activity tolerance, decreased balance, decreased endurance, decreased mobility, difficulty walking, decreased ROM, decreased strength, hypomobility, increased edema, and pain.   ACTIVITY LIMITATIONS: carrying, lifting, bending, sitting, standing, squatting, sleeping, stairs, transfers, bed mobility, and locomotion level  PARTICIPATION LIMITATIONS: cleaning, laundry, driving, community activity, and occupation  PERSONAL FACTORS: Fitness, Time since onset of injury/illness/exacerbation, and 3+ comorbidities: obesity, HTN, OA  are also affecting patient's functional outcome.   REHAB POTENTIAL: Good  CLINICAL DECISION MAKING:  Stable/uncomplicated  EVALUATION COMPLEXITY: Low   GOALS: Goals reviewed with patient? No  SHORT TERM GOALS: Target date: 01/14/2024  Pt will be independent with HEP in order to improve strength and decrease pain in order to improve pain-free function at home and work.  Baseline: NT 01/16/24: Able to perform independently  Goal status: ACHIEVED   LONG TERM GOALS: Target date: 03/10/2024  Pt will decrease their KOOS score by at least 10 points to show significant increase in knee function.  Baseline: 32% 01/29/24 64%  Goal status: ACHIEVED   2.  Pt will increase their left knee flexion AROM to be within 10% of the right knee to show significant improvement of knee range of motion to return to activities that require squatting and bending.  Baseline: Knee flexion R/L 107/61 01/29/24: Knee Flex R 83 deg  Goal status: PARTIALLY MET    3.  Pt will increase their left hip flexion and knee extension strength by at least 1/3 MMT grade (4- to 4) to return to ambulating safely without an assistive device.  Baseline: Hip flexion R/L 4/3+*; Knee extension R/L 4+/4-* Goal status: ONGOING  4.  Pt will increase by at least 76m (125ft) in order to demonstrate clinically significant improvement in community ambulation. Baseline: 740 ft Goal status: ONGOING   PLAN:  PT FREQUENCY: 2x/week  PT DURATION: 10 weeks  PLANNED INTERVENTIONS: 97110-Therapeutic exercises, 97530- Therapeutic activity, 97112- Neuromuscular re-education, 97535- Self Care, 96045- Manual therapy, [phone]- Gait training, Patient/Family education, Balance training, Stair training, Joint mobilization, Joint manipulation, Scar mobilization, Cryotherapy, Moist heat, and Biofeedback  PLAN FOR NEXT SESSION: Recumbent bicycle warm up or Nu-Step at 6. Continue with knee flexion mobilizations and ongoing AROM knee flexion exercises. Progress Knee step up52mWT test and knee strength test.   Ellin Goodie PT, DPT  [facility] [address] [place], [address] Phone: [phone]   Fax:  [phone]

## 2024-02-04 ENCOUNTER — Ambulatory Visit: Payer: Self-pay | Admitting: Physical Therapy

## 2024-02-04 DIAGNOSIS — M25562 Pain in left knee: Secondary | ICD-10-CM

## 2024-02-04 NOTE — Therapy (Unsigned)
OUTPATIENT PHYSICAL THERAPY TREATMENT   Patient Name: Tammy Wright MRN: [id number] DOB:1966-04-21, 58 y.o., female Today's Date: 02/04/2024  END OF SESSION:  PT End of Session - 02/04/24 1825     Visit Number 8    Number of Visits 20    Date for PT Re-Evaluation 03/10/24    Authorization Type [id number]    Authorization Time Period VL 60/55 remain    Authorization - Visit Number 8    Authorization - Number of Visits 20    Progress Note Due on Visit 10    PT Start Time 1820    PT Stop Time 1900    PT Time Calculation (min) 40 min    Activity Tolerance Patient tolerated treatment well    Behavior During Therapy [facility] for tasks assessed/performed                No past medical history on file. No past surgical history on file. 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  Rationale for Evaluation and Treatment: Rehabilitation  ONSET DATE: January 27th  SUBJECTIVE:   SUBJECTIVE STATEMENT:          Pt continues to perform cycling on her sister in law's bike at home. She is able to do full rotations on her sister's bike.     PERTINENT HISTORY: Pt is a 58 y/o female s/p left TKA on January 27th and was seen by home health PT. She also reports having some pain in her right knee as well. She reports having trouble with regaining flexion/bending the knee. Pt has neuropathy which causes knee to be aggravated around 3-4 am before taking the pain medication. She says her doctor says it is probably due  to the the nerve block. She reports the swelling has been bothering her and has not gone down. She reports that she moved to the [facility] a week ago from the [place] which made her feel too hunched over and she is working on not using the cane either. Pt reports some numbness on anterior knee from the nerve block but no tingling. Pt says walking around has gone well, it has been not too painful and can usually walk about 15-20 minutes before having to sit down.  PAIN:  Are you having pain? Coming in without any pain today  Yes: NPRS scale: 7/10 worst when flexing left knee when cycling, 0/10 most of the time Pain location: the top of the left knee  Pain description: aching  Aggravating factors: laying on the right (not on the left), bending the knee, PT  Relieving factors: pain medication, rest   PRECAUTIONS: None  RED FLAGS: None   WEIGHT BEARING RESTRICTIONS: No  FALLS:  Has patient fallen in last 6 months? 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  Vitals: BP: 112/60  HR: 75   SpO2: 100  DIAGNOSTIC FINDINGS: None  PATIENT SURVEYS:  KOOS : 32%  COGNITION: Overall cognitive status: Within functional limits for tasks assessed     SENSATION: WFL  EDEMA:  Circumferential: R 17 in L 21 in  MUSCLE LENGTH: NT  POSTURE: No Significant postural limitations  PALPATION: No TTP   LOWER EXTREMITY ROM:  Active ROM Right eval Left eval  Hip flexion    Hip extension    Hip abduction    Hip adduction    Hip internal  rotation    Hip external rotation    Knee flexion 107/107 61/65  Knee extension 0  0  Ankle dorsiflexion    Ankle plantarflexion    Ankle inversion    Ankle eversion     (Blank rows = not tested)  LOWER EXTREMITY MMT:  MMT Right eval Left eval  Hip flexion 4 3+*  Hip extension    Hip abduction    Hip adduction    Hip internal rotation    Hip external rotation    Knee flexion 4+ 4+*  Knee extension 4+ 4-*  Ankle dorsiflexion    Ankle plantarflexion    Ankle inversion    Ankle eversion     (Blank rows = not tested)  FUNCTIONAL TESTS:  6 minute walk test: 740 ft  GAIT: Distance walked: to be assessed visit #2 Assistive device utilized: Single point cane Level of assistance: Modified independence Comments: decreased L knee flexion, increased stance time on RLE, decreased stance time on LLE, increased trunk lean to the right to clear left foot, increased knee valgus                                                                                                                               TREATMENT DATE:   02/04/23: THEREX  Matrix Recumbent bike with seat at 14 with half rotations 5 min  TRX Squat into chair 2 x 10  -min VC to maintain heels close to chair seat   Left Knee Flexion on 2nd Step with BUE support and 2 sec hold 2 x 10  OMEGA Leg Extension with overpressure into knee flexion #15 2 x 10 with 2 sec hold   -NRPS 7/10 localized over left patella Seated Knee Flexion to 90 degrees on LLE  Supine Left Knee Flexion PROM 1 x 5  -Pt unable to perform due to increased anterior knee pain localized over middle portion of surgical scar  01/31/24: THEREX NU-Step seat at 7 with 2 resistance  Self Mobilizations for left knee flexion in long sitting X 30  -min VC for patient to provide downward and diagonal force Prone Quad Stretch of LLE  3 x  30 sec  Mini-Squat 2 x 10 with BUE support    MANUAL Posterior knee glide on Right knee grade IV mobs 3 x 30  Gentle left  knee flexion and extension PROM  Supine left Hamstring Stretch 2 x 30 sec   01/29/24: THEREX  Nu-Step seat and arms at 9 with resistance at 3  for 5 min  R knee flex AROM 83 Fibularis longus stretch at wall 2 x 30 sec  Reviewed HEP with emphasis on knee flexion exercises  KOOS Score: 64%    Trigger Point Dry Needling  Initial Treatment: Pt instructed on Dry Needling rational, procedures, and possible side effects. Pt instructed to expect mild to moderate muscle soreness later in the day and/or into the next day.  Pt instructed in methods to reduce muscle soreness. Pt instructed to continue prescribed HEP. Patient was educated on signs and symptoms of infection and other risk factors and advised to seek medical attention should they occur.  Patient verbalized understanding of these instructions and education.   Patient Verbal Consent Given: Yes Education Handout Provided: Yes Muscles Treated: Left fibularis longus  Electrical Stimulation Performed: No Treatment Response/Outcome: No twitches    01/23/24:  SELF CARE  TENS with inferential at 15 ma CC with box setup for 10 min  -Box setup with pads   -Pt reports about same amount of knee pain with knee flexion   THERAPEUTIC ACTIVITY  Mini-Squat with BUE support 1 x 10  Step Up on LLE on 4 inch step 1 x 10 with BUE support   Step Up on LLE on 6 inch step 1 x 10 with BUE support  Step Up on LLE on 12 inch step 1 x 10 with BUE support     01/21/24: THEREX Nu-Step with seat and arms at 9 with no resistance for 6 min  Seated Knee Flexion Scoots on LLE 1 x 10  -Pt reports increased pain in LLE  Seated Knee Flexion AAROM in LLE 1 x 10 with 3 sec hold   THERAC  TRX squats 1 x 10  TRX squats with chair behind for increased knee flexion and visual cue  2 x 10  -min VC to bring heels back for increased knee flexion  Step Up on LLE with BUE support on 4 inch step 1 x 10  Step Up on LLE with BUE support on 6 inch step 1 X 10  Step  Up on LLE with 1 UE support on 6 inch step 1 x 10  Step Up on LLE with BUE support on 8 inch step 1 x 10   01/16/24: THEREX Nu-Step with seat at 9 with no resistance for 6 min  Seated Scoots for Increased Knee Flexion 10 sec  x 5  Standing Left Knee Flexion AAROM 2 x 10  Mini-Squat with BUE support 3 x 10   SELF-CARE Scare Tissue Massage with video on Medbridge    PATIENT EDUCATION:  Education details: Form and technique for correct performance of exercise and explanation about exam findings Person educated: Patient Education method: Explanation and Verbal cues Education comprehension: verbalized understanding, returned demonstration, and verbal cues required  HOME EXERCISE PROGRAM: Access Code: [email or web address] URL: [email or web address] Date: 01/31/2024 Prepared by: Ellin Goodie  Exercises - Prone Quad Stretch with Towel Roll and Strap  - 1 x daily - 7 x weekly - 3 reps - 30 sec hold - Seated Hamstring Stretch  - 1 x daily - 7 x weekly - 3 sets -  30 sec hold - Seated Knee Flexion AAROM  - 1 x daily - 2 sets - 10 reps - 3 sec   hold - Standing Knee Flexion Stretch on Step  - 1 x daily - 7 x weekly - 2 sets - 10 reps - 3 sec  hold - Seated Knee Flexion Stretch  - 1 x daily - 7 x weekly - 2 sets - 10 reps - 3 sec  hold - Mini Squat with Counter Support  - 3-4 x weekly - 3 sets - 10 reps - Forward Step Up with Counter Support  - 3-4 x weekly - 3 sets - 10 reps  Patient Education - Scar Massage  Patient Education - Scar Massage  ASSESSMENT:  CLINICAL IMPRESSION: Pt demonstrates improved left knee mobility with mobilizations and AROM exercises. She was able to tolerate left knee flexion mobilizations with use of ice to dampen pain. She will benefit from further PT to treat these deficits and return to being able to bend and lift her leg to return to work.   OBJECTIVE IMPAIRMENTS: Abnormal gait, decreased activity tolerance, decreased balance, decreased endurance,  decreased mobility, difficulty walking, decreased ROM, decreased strength, hypomobility, increased edema, and pain.   ACTIVITY LIMITATIONS: carrying, lifting, bending, sitting, standing, squatting, sleeping, stairs, transfers, bed mobility, and locomotion level  PARTICIPATION LIMITATIONS: cleaning, laundry, driving, community activity, and occupation  PERSONAL FACTORS: Fitness, Time since onset of injury/illness/exacerbation, and 3+ comorbidities: obesity, HTN, OA  are also affecting patient's functional outcome.   REHAB POTENTIAL: Good  CLINICAL DECISION MAKING: Stable/uncomplicated  EVALUATION COMPLEXITY: Low   GOALS: Goals reviewed with patient? No  SHORT TERM GOALS: Target date: 01/14/2024  Pt will be independent with HEP in order to improve strength and decrease pain in order to improve pain-free function at home and work.  Baseline: NT 01/16/24: Able to perform independently  Goal status: ACHIEVED   LONG TERM GOALS: Target date: 03/10/2024  Pt will decrease their KOOS score by at least 10 points to show significant increase in knee function.  Baseline: 32% 01/29/24 64%  Goal status: ACHIEVED   2.  Pt will increase their left knee flexion AROM to be within 10% of the right knee to show significant improvement of knee range of motion to return to activities that require squatting and bending.  Baseline: Knee flexion R/L 107/61 01/29/24: Knee Flex R 83 deg  Goal status: PARTIALLY MET    3.  Pt will increase their left hip flexion and knee extension strength by at least 1/3 MMT grade (4- to 4) to return to ambulating safely without an assistive device.  Baseline: Hip flexion R/L 4/3+*; Knee extension R/L 4+/4-* Goal status: ONGOING  4.  Pt will increase by at least 106m (123ft) in order to demonstrate clinically significant improvement in community ambulation. Baseline: 740 ft Goal status: ONGOING   PLAN:  PT FREQUENCY: 2x/week  PT DURATION: 10 weeks  PLANNED  INTERVENTIONS: 97110-Therapeutic exercises, 97530- Therapeutic activity, 97112- Neuromuscular re-education, 97535- Self Care, 60109- Manual therapy, [phone]- Gait training, Patient/Family education, Balance training, Stair training, Joint mobilization, Joint manipulation, Scar mobilization, Cryotherapy, Moist heat, and Biofeedback  PLAN FOR NEXT SESSION: Recumbent bicycle warm up or Nu-Step at 6. Continue with knee flexion mobilizations and ongoing AROM knee flexion exercises. Progress Knee step up98mWT test and knee strength test.   Ellin Goodie PT, DPT  [facility] [address] [place], [address] Phone: [phone]   Fax:  [phone]   

## 2024-02-07 ENCOUNTER — Ambulatory Visit: Payer: Self-pay | Attending: Orthopedic Surgery | Admitting: Physical Therapy

## 2024-02-07 ENCOUNTER — Encounter: Payer: Self-pay | Admitting: Physical Therapy

## 2024-02-07 DIAGNOSIS — M25562 Pain in left knee: Secondary | ICD-10-CM | POA: Diagnosis present

## 2024-02-07 NOTE — Therapy (Signed)
 OUTPATIENT PHYSICAL THERAPY TREATMENT   Patient Name: Tammy Wright MRN: [id number] DOB:May 28, 1966, 58 y.o., female Today's Date: 02/07/2024  END OF SESSION:  PT End of Session - 02/07/24 1354     Visit Number 9    Number of Visits 20    Date for PT Re-Evaluation 03/10/24    Authorization Type [id number]    Authorization Time Period VL 60/55 remain    Authorization - Visit Number 9    Authorization - Number of Visits 20    Progress Note Due on Visit 10    PT Start Time 1350    PT Stop Time 1430    PT Time Calculation (min) 40 min    Activity Tolerance Patient tolerated treatment well    Behavior During Therapy [facility] for tasks assessed/performed                History reviewed. No pertinent past medical history. History reviewed. No pertinent surgical history. 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 She will be seeing her surgeon next week on April 10th.   PERTINENT HISTORY: Pt is a 58 y/o female s/p left TKA on January 27th and was seen by home health PT. She also reports having some pain in her right knee as well. She reports having trouble with regaining flexion/bending the knee. Pt has neuropathy which causes knee to be aggravated around 3-4 am before taking the pain medication. She says her  doctor says it is probably due to the the nerve block. She reports the swelling has been bothering her and has not gone down. She reports that she moved to the [facility] a week ago from the [place] which made her feel too hunched over and she is working on not using the cane either. Pt reports some numbness on anterior knee from the nerve block but no tingling. Pt says walking around has gone well, it has been not too painful and can usually walk about 15-20 minutes before having to sit down.  PAIN:  Are you having pain? Coming in without any pain today  Yes: NPRS scale: 7/10 worst when flexing left knee when cycling, 0/10 most of the time Pain location: the top of the left knee  Pain description: aching  Aggravating factors: laying on the right (not on the left), bending the knee, PT  Relieving factors: pain medication, rest   PRECAUTIONS: None  RED FLAGS: None   WEIGHT BEARING RESTRICTIONS: No  FALLS:  Has patient fallen in last 6 months? 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  Vitals: BP: 112/60  HR: 75   SpO2: 100  DIAGNOSTIC FINDINGS: None  PATIENT SURVEYS:  KOOS : 32%  COGNITION: Overall cognitive status: Within functional limits for tasks assessed     SENSATION: WFL  EDEMA:  Circumferential: R 17 in L 21 in  MUSCLE LENGTH: NT  POSTURE: No Significant postural limitations  PALPATION: No TTP   LOWER EXTREMITY ROM:  Active ROM Right eval Left eval  Hip flexion    Hip extension    Hip abduction     Hip adduction    Hip internal rotation    Hip external rotation    Knee flexion 107/107 61/65  Knee extension 0  0  Ankle dorsiflexion    Ankle plantarflexion    Ankle inversion    Ankle eversion     (Blank rows = not tested)  LOWER EXTREMITY MMT:  MMT Right eval Left eval  Hip flexion 4 3+*  Hip extension    Hip abduction    Hip adduction    Hip internal rotation    Hip external rotation    Knee flexion 4+ 4+*  Knee extension 4+ 4-*  Ankle dorsiflexion    Ankle plantarflexion    Ankle inversion    Ankle eversion     (Blank rows = not tested)  FUNCTIONAL TESTS:  6 minute walk test: 740 ft  GAIT: Distance walked: to be assessed visit #2 Assistive device utilized: Single point cane Level of assistance: Modified independence Comments: decreased L knee flexion, increased stance time on RLE, decreased stance time on LLE, increased trunk lean to the right to clear left foot, increased knee valgus                                                                                                                               TREATMENT DATE:   02/07/24: THEREX  Matrix Recumbent bike with seat at 14 with half rotations 5 min Seated Left Knee Flexion AAROM 2 x 10   MANUAL  Tibofemural glides anterior to posterior glides grade III-IV with knee in 60 degrees flexion and patient in long sitting x 30  Movement with mobilization with posterior to anterior glides with knee flexion AROM on 6 inch step x 30   02/04/23: THEREX  Matrix Recumbent bike with seat at 14 with half rotations 5 min  TRX Squat into chair 2 x 10  -min VC to maintain heels close to chair seat   Left Knee Flexion on 2nd Step with BUE support and 2 sec hold 2 x 10  OMEGA Leg Extension with overpressure into knee flexion #15 2 x 10 with 2 sec hold   -NRPS 7/10 localized over left patella Seated Knee Flexion to 90 degrees on LLE  Supine Left Knee Flexion PROM  1 x 5  -Pt unable to perform due to increased  anterior knee pain localized over middle portion of surgical scar  01/31/24: THEREX NU-Step seat at 7 with 2 resistance  Self Mobilizations for left knee flexion in long sitting X 30  -min VC for patient to provide downward and diagonal force Prone Quad Stretch of LLE  3 x 30 sec  Mini-Squat 2 x 10 with BUE support    MANUAL Posterior knee glide on Right knee grade IV mobs 3 x 30  Gentle left knee flexion and extension PROM  Supine left Hamstring Stretch 2 x 30 sec   01/29/24: THEREX  Nu-Step seat and arms at 9 with resistance at 3  for 5 min  R knee flex AROM 83 Fibularis longus stretch at wall 2 x 30 sec  Reviewed HEP with emphasis on knee flexion exercises  KOOS Score: 64%    Trigger Point Dry Needling  Initial Treatment: Pt instructed on Dry Needling rational, procedures, and possible side effects. Pt instructed to expect mild to moderate muscle soreness later in the day and/or into the next day.  Pt instructed in methods to reduce muscle soreness. Pt instructed to continue prescribed HEP. Patient was educated on signs and symptoms of infection and other risk factors and advised to seek medical attention should they occur.  Patient verbalized understanding of these instructions and education.   Patient Verbal Consent Given: Yes Education Handout Provided: Yes Muscles Treated: Left fibularis longus  Electrical Stimulation Performed: No Treatment Response/Outcome: No twitches    01/23/24:  SELF CARE  TENS with inferential at 15 ma CC with box setup for 10 min  -Box setup with pads   -Pt reports about same amount of knee pain with knee flexion   THERAPEUTIC ACTIVITY  Mini-Squat with BUE support 1 x 10  Step Up on LLE on 4 inch step 1 x 10 with BUE support   Step Up on LLE on 6 inch step 1 x 10 with BUE support  Step Up on LLE on 12 inch step 1 x 10 with BUE support     01/21/24: THEREX Nu-Step with seat and arms at 9 with no resistance for 6 min  Seated Knee  Flexion Scoots on LLE 1 x 10  -Pt reports increased pain in LLE  Seated Knee Flexion AAROM in LLE 1 x 10 with 3 sec hold   THERAC  TRX squats 1 x 10  TRX squats with chair behind for increased knee flexion and visual cue  2 x 10  -min VC to bring heels back for increased knee flexion  Step Up on LLE with BUE support on 4 inch step 1 x 10  Step Up on LLE with BUE support on 6 inch step 1 X 10  Step Up on LLE with 1 UE support on 6 inch step 1 x 10  Step Up on LLE with BUE support on 8 inch step 1 x 10   01/16/24: THEREX Nu-Step with seat at 9 with no resistance for 6 min  Seated Scoots for Increased Knee Flexion 10 sec  x 5  Standing Left Knee Flexion AAROM 2 x 10  Mini-Squat with BUE support 3 x 10   SELF-CARE Scare Tissue Massage with video on Med[employer]    PATIENT EDUCATION:  Education details: Form and technique for correct performance of exercise and explanation about exam findings Person educated: Patient Education method: Explanation and Verbal cues Education comprehension: verbalized understanding, returned demonstration, and  verbal cues required  HOME EXERCISE PROGRAM: Access Code: [email or web address] URL: [email or web address] Date: 02/07/2024 Prepared by: Ellin Goodie  Exercises - Seated Hamstring Stretch  - 1 x daily - 7 x weekly - 3 sets - 30 sec hold - Seated Knee Flexion AAROM  - 1 x daily - 2 sets - 10 reps - 3 sec   hold - Standing Knee Flexion Stretch on Step  - 1 x daily - 7 x weekly - 2 sets - 10 reps - 3 sec  hold - Seated Knee Flexion Stretch  - 1 x daily - 7 x weekly - 2 sets - 10 reps - 3 sec  hold - Mini Squat with Counter Support  - 3-4 x weekly - 3 sets - 10 reps - Forward Step Up with Counter Support  - 3-4 x weekly - 3 sets - 10 reps - Theatre manager with Chair and Counter Support  - 1 x daily - 7 x weekly - 3 reps - 30-60 sec hold  Patient Education - Scar Massage  ASSESSMENT:  CLINICAL IMPRESSION: Pt continues to be limited by  right knee incision pain especially with knee flexion. She was able to tolerate mobilizations, but then this worsened her incision pain. She is nearing the end of her plan of care with ongoing decreased right knee mobility. She will continue benefit from further PT to treat these deficits and return to being able to bend and lift her leg to return to work.   OBJECTIVE IMPAIRMENTS: Abnormal gait, decreased activity tolerance, decreased balance, decreased endurance, decreased mobility, difficulty walking, decreased ROM, decreased strength, hypomobility, increased edema, and pain.   ACTIVITY LIMITATIONS: carrying, lifting, bending, sitting, standing, squatting, sleeping, stairs, transfers, bed mobility, and locomotion level  PARTICIPATION LIMITATIONS: cleaning, laundry, driving, community activity, and occupation  PERSONAL FACTORS: Fitness, Time since onset of injury/illness/exacerbation, and 3+ comorbidities: obesity, HTN, OA  are also affecting patient's functional outcome.   REHAB POTENTIAL: Good  CLINICAL DECISION MAKING: Stable/uncomplicated  EVALUATION COMPLEXITY: Low   GOALS: Goals reviewed with patient? No  SHORT TERM GOALS: Target date: 01/14/2024  Pt will be independent with HEP in order to improve strength and decrease pain in order to improve pain-free function at home and work.  Baseline: NT 01/16/24: Able to perform independently  Goal status: ACHIEVED   LONG TERM GOALS: Target date: 03/10/2024  Pt will decrease their KOOS score by at least 10 points to show significant increase in knee function.  Baseline: 32% 01/29/24 64%  Goal status: ACHIEVED   2.  Pt will increase their left knee flexion AROM to be within 10% of the right knee to show significant improvement of knee range of motion to return to activities that require squatting and bending.  Baseline: Knee flexion R/L 107/61 01/29/24: Knee Flex R 83 deg  Goal status: PARTIALLY MET    3.  Pt will increase their left hip  flexion and knee extension strength by at least 1/3 MMT grade (4- to 4) to return to ambulating safely without an assistive device.  Baseline: Hip flexion R/L 4/3+*; Knee extension R/L 4+/4-* Goal status: ONGOING  4.  Pt will increase by at least 33m (180ft) in order to demonstrate clinically significant improvement in community ambulation. Baseline: 740 ft Goal status: ONGOING   PLAN:  PT FREQUENCY: 2x/week  PT DURATION: 10 weeks  PLANNED INTERVENTIONS: 97110-Therapeutic exercises, 97530- Therapeutic activity, [id number]- Neuromuscular re-education, 97535- Self Care, [address]- Manual therapy, [phone]- Gait training, Patient/Family education, Balance training, Stair  training, Joint mobilization, Joint manipulation, Scar mobilization, Cryotherapy, Moist heat, and Biofeedback  PLAN FOR NEXT SESSION: Reassess the remainder of goals . Review home exercise plan .   Ellin Goodie PT, DPT  [facility] [address], [place], [address] Phone: [phone]   Fax:  [phone]

## 2024-02-11 ENCOUNTER — Ambulatory Visit: Payer: Self-pay | Admitting: Physical Therapy

## 2024-02-11 DIAGNOSIS — M25562 Pain in left knee: Secondary | ICD-10-CM | POA: Diagnosis not present

## 2024-02-11 NOTE — Therapy (Signed)
 OUTPATIENT PHYSICAL THERAPY PROGRESS   Dates of Reporting: 12/31/23-02/11/24  Patient Name: Tammy Wright MRN: [id number] DOB:[birth year], 58 y.o., female Today's Date: 02/11/2024  END OF SESSION:  PT End of Session - 02/11/24 1528     Visit Number 10    Number of Visits 20    Date for PT Re-Evaluation 03/10/24    Authorization Type [id number]    Authorization Time Period VL 60/55 remain    Authorization - Visit Number 10    Authorization - Number of Visits 20    Progress Note Due on Visit 10    PT Start Time 1525    PT Stop Time 1605    PT Time Calculation (min) 40 min    Activity Tolerance Patient tolerated treatment well    Behavior During Therapy [facility] for tasks assessed/performed                 No past medical history on file. No past surgical history on file. 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  PERTINENT HISTORY: Pt is a 58 y/o female s/p left TKA on January 27th and was seen by home health PT. She also reports having some pain in her right knee as well. She reports having trouble with regaining flexion/bending the knee. Pt has neuropathy which causes knee to be aggravated around 3-4 am before taking the pain medication. She says her  doctor says it is probably due to the the nerve block. She reports the swelling has been bothering her and has not gone down. She reports that she moved to the [facility] a week ago from the [place] which made her feel too hunched over and she is working on not using the cane either. Pt reports some numbness on anterior knee from the nerve block but no tingling. Pt says walking around has gone well, it has been not too painful and can usually walk about 15-20 minutes before having to sit down.  PAIN:  Are you having pain? Coming in without any pain today  Yes: NPRS scale: 7/10 worst when flexing left knee when cycling, 0/10 most of the time Pain location: the top of the left knee  Pain description: aching  Aggravating factors: laying on the right (not on the left), bending the knee, PT  Relieving factors: pain medication, rest   PRECAUTIONS: None  RED FLAGS: None   WEIGHT BEARING RESTRICTIONS: No  FALLS:  Has patient fallen in last 6 months? 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  Vitals: BP: 112/60  HR: 75   SpO2: 100  DIAGNOSTIC FINDINGS: None  PATIENT SURVEYS:  KOOS : 32%  COGNITION: Overall cognitive status: Within functional limits for tasks assessed     SENSATION: WFL  EDEMA:  Circumferential: R 17 in L 21 in  MUSCLE LENGTH: NT  POSTURE: No Significant postural limitations  PALPATION: No TTP   LOWER EXTREMITY ROM:  Active ROM Right eval Left eval  Hip flexion    Hip extension    Hip abduction     Hip adduction    Hip internal rotation    Hip external rotation    Knee flexion 107/107 61/65  Knee extension 0  0  Ankle dorsiflexion    Ankle plantarflexion    Ankle inversion    Ankle eversion     (Blank rows = not tested)  LOWER EXTREMITY MMT:  MMT Right eval Left eval  Hip flexion 4 3+*  Hip extension    Hip abduction    Hip adduction    Hip internal rotation    Hip external rotation    Knee flexion 4+ 4+*  Knee extension 4+ 4-*  Ankle dorsiflexion    Ankle plantarflexion    Ankle inversion    Ankle eversion     (Blank rows = not tested)  FUNCTIONAL TESTS:  6 minute walk test: 740 ft  GAIT: Distance walked: to be assessed visit #2 Assistive device utilized: Single point cane Level of assistance: Modified independence Comments: decreased L knee flexion, increased stance time on RLE, decreased stance time on LLE, increased trunk lean to the right to clear left foot, increased knee valgus                                                                                                                               TREATMENT DATE:   02/11/24: Pt is donning TENS unit throughout entire session.  THEREX:  Nu-Step with seat at 7 for 5 min   SELF CARE HOME MANAGEMENT   Education about settings for TENS unit with pulse rate and pad position for pain modulation Review of home exercise plan and modifications to exercises   PHYSICAL PERFORMANCE   : 1,050 ft  -Decreased step length bilaterally  Knee Ext R/L 4+/4+  Knee Flex AROM R 90 deg  02/07/24: THEREX  Matrix Recumbent bike with seat at 14 with half rotations 5 min Seated Left Knee Flexion AAROM 2 x 10   MANUAL  Tibofemural glides anterior to posterior glides grade III-IV with knee in 60 degrees flexion and patient in long sitting x 30  Movement with mobilization with posterior to anterior glides with knee flexion AROM on 6 inch step x 30   02/04/23: THEREX  Matrix Recumbent bike with seat at 14 with  half  rotations 5 min  TRX Squat into chair 2 x 10  -min VC to maintain heels close to chair seat   Left Knee Flexion on 2nd Step with BUE support and 2 sec hold 2 x 10  OMEGA Leg Extension with overpressure into knee flexion #15 2 x 10 with 2 sec hold   -NRPS 7/10 localized over left patella Seated Knee Flexion to 90 degrees on LLE  Supine Left Knee Flexion PROM 1 x 5  -Pt unable to perform due to increased anterior knee pain localized over middle portion of surgical scar  01/31/24: THEREX NU-Step seat at 7 with 2 resistance  Self Mobilizations for left knee flexion in long sitting X 30  -min VC for patient to provide downward and diagonal force Prone Quad Stretch of LLE  3 x 30 sec  Mini-Squat 2 x 10 with BUE support    MANUAL Posterior knee glide on Right knee grade IV mobs 3 x 30  Gentle left knee flexion and extension PROM  Supine left Hamstring Stretch 2 x 30 sec   01/29/24: THEREX  Nu-Step seat and arms at 9 with resistance at 3  for 5 min  R knee flex AROM 83 Fibularis longus stretch at wall 2 x 30 sec  Reviewed HEP with emphasis on knee flexion exercises  KOOS Score: 64%    Trigger Point Dry Needling  Initial Treatment: Pt instructed on Dry Needling rational, procedures, and possible side effects. Pt instructed to expect mild to moderate muscle soreness later in the day and/or into the next day.  Pt instructed in methods to reduce muscle soreness. Pt instructed to continue prescribed HEP. Patient was educated on signs and symptoms of infection and other risk factors and advised to seek medical attention should they occur.  Patient verbalized understanding of these instructions and education.   Patient Verbal Consent Given: Yes Education Handout Provided: Yes Muscles Treated: Left fibularis longus  Electrical Stimulation Performed: No Treatment Response/Outcome: No twitches    01/23/24:  SELF CARE  TENS with inferential at 15 ma CC with box setup for 10 min  -Box  setup with pads   -Pt reports about same amount of knee pain with knee flexion   THERAPEUTIC ACTIVITY  Mini-Squat with BUE support 1 x 10  Step Up on LLE on 4 inch step 1 x 10 with BUE support   Step Up on LLE on 6 inch step 1 x 10 with BUE support  Step Up on LLE on 12 inch step 1 x 10 with BUE support      PATIENT EDUCATION:  Education details: Form and technique for correct performance of exercise and explanation about exam findings Person educated: Patient Education method: Explanation and Verbal cues Education comprehension: verbalized understanding, returned demonstration, and verbal cues required  HOME EXERCISE PROGRAM: Access Code: [email or web address] URL: [email or web address] Date: 02/11/2024 Prepared by: Ellin Goodie  Exercises - Seated Hamstring Stretch  - 1 x daily - 7 x weekly - 3 sets - 30 sec hold - Seated Knee Flexion AAROM  - 1 x daily - 2 sets - 10 reps - 3 sec   hold - Standing Knee Flexion Stretch on Step  - 1 x daily - 7 x weekly - 2 sets - 10 reps - 3 sec  hold - Seated Knee Flexion Stretch  - 1 x daily - 7 x weekly - 2 sets - 10 reps - 3 sec  hold - Forward Step Up with Counter Support  -  3-4 x weekly - 3 sets - 10 reps - Theatre manager with Chair and Counter Support  - 1 x daily - 7 x weekly - 3 reps - 30 sec  hold  Patient Education - Scar Massage  ASSESSMENT:  CLINICAL IMPRESSION: Despite pt continuing to be limited by right knee mobility, she demonstrates progress towards goals with improved left knee strength and ability to perform all functionally necessary tasks like walking and stepping up. She can perform all functional tasks for her job safely. She will likely not benefit from further PT given that limited knee ROM has not resulted after several weeks.    OBJECTIVE IMPAIRMENTS: Abnormal gait, decreased activity tolerance, decreased balance, decreased endurance, decreased mobility, difficulty walking, decreased ROM, decreased strength,  hypomobility, increased edema, and pain.   ACTIVITY LIMITATIONS: carrying, lifting, bending, sitting, standing, squatting, sleeping, stairs, transfers, bed mobility, and locomotion level  PARTICIPATION LIMITATIONS: cleaning, laundry, driving, community activity, and occupation  PERSONAL FACTORS: Fitness, Time since onset of injury/illness/exacerbation, and 3+ comorbidities: obesity, HTN, OA  are also affecting patient's functional outcome.   REHAB POTENTIAL: Good  CLINICAL DECISION MAKING: Stable/uncomplicated  EVALUATION COMPLEXITY: Low   GOALS: Goals reviewed with patient? No  SHORT TERM GOALS: Target date: 01/14/2024  Pt will be independent with HEP in order to improve strength and decrease pain in order to improve pain-free function at home and work.  Baseline: NT 01/16/24: Able to perform independently  Goal status: ACHIEVED   LONG TERM GOALS: Target date: 03/10/2024  Pt will decrease their KOOS score by at least 10 points to show significant increase in knee function.  Baseline: 32% 01/29/24 64%  Goal status: ACHIEVED   2.  Pt will increase their left knee flexion AROM to be within 10% of the right knee to show significant improvement of knee range of motion to return to activities that require squatting and bending.  Baseline: Knee flexion R/L 107/61 01/29/24: Knee Flex R 83 deg 02/11/24:  Knee Flexion L 90  Goal status: PARTIALLY MET    3.  Pt will increase their left hip flexion and knee extension strength by at least 1/3 MMT grade (4- to 4) to return to ambulating safely without an assistive device.  Baseline: Hip flexion R/L 4/3+*; Knee extension R/L 4+/4-* 02/11/24: Knee Ext R/L 4+/4+  Goal status: ACHIEVED   4.  Pt will increase by at least 33m (190ft) in order to demonstrate clinically significant improvement in community ambulation. Baseline: 740 ft  02/11/24: 1,040 ft  Goal status: ACHIEVED    PLAN:  PT FREQUENCY: 2x/week  PT DURATION: 10 weeks  PLANNED  INTERVENTIONS: 97110-Therapeutic exercises, 97530- Therapeutic activity, 97112- Neuromuscular re-education, 97535- Self Care, 28413- Manual therapy, [phone]- Gait training, Patient/Family education, Balance training, Stair training, Joint mobilization, Joint manipulation, Scar mobilization, Cryotherapy, Moist heat, and Biofeedback  PLAN FOR NEXT SESSION: Pt unlikely to benefit from more PT.   Ellin Goodie PT, DPT  [facility] [address], [place], [address] Phone: [phone]   Fax:  [phone]

## 2024-02-13 ENCOUNTER — Ambulatory Visit: Payer: Self-pay | Admitting: Physical Therapy

## 2024-02-18 ENCOUNTER — Ambulatory Visit: Payer: Self-pay | Admitting: Physical Therapy

## 2024-02-20 ENCOUNTER — Ambulatory Visit: Payer: Self-pay

## 2024-02-20 ENCOUNTER — Encounter: Payer: Self-pay | Admitting: Physical Therapy

## 2024-02-20 DIAGNOSIS — M25562 Pain in left knee: Secondary | ICD-10-CM | POA: Diagnosis not present

## 2024-02-20 NOTE — Therapy (Signed)
 OUTPATIENT PHYSICAL THERAPY TREATMENT   Dates of Reporting: 12/31/23-02/11/24  Patient Name: Tammy Wright MRN: [id number] DOB:Aug 20, 1966, 58 y.o., female Today's Date: 02/20/2024  END OF SESSION:  PT End of Session - 02/20/24 1519     Visit Number 11    Number of Visits 20    Date for PT Re-Evaluation 03/10/24    Authorization Type [id number]    Authorization Time Period VL 60/55 remain    Authorization - Number of Visits 20    Progress Note Due on Visit 10    PT Start Time 1518    PT Stop Time 1557    PT Time Calculation (min) 39 min    Activity Tolerance Patient tolerated treatment well    Behavior During Therapy [facility] for tasks assessed/performed                 History reviewed. No pertinent past medical history. History reviewed. No pertinent surgical history. 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   PERTINENT HISTORY: Pt is a 58 y/o female s/p left TKA on January 27th and was seen by home health PT. She also reports having some pain in her right knee as well. She reports having trouble with regaining flexion/bending the knee. Pt has neuropathy which causes knee to be aggravated around 3-4 am before taking the pain medication. She says her doctor says it is probably  due to the the nerve block. She reports the swelling has been bothering her and has not gone down. She reports that she moved to the [facility] a [place] ago from the [place] which made her feel too hunched over and she is working on not using the cane either. Pt reports some numbness on anterior knee from the nerve block but no tingling. Pt says walking around has gone well, it has been not too painful and can usually walk about 15-20 minutes before having to sit down.  PAIN:  Are you having pain? Coming in without any pain today  Yes: NPRS scale: 7/10 worst when flexing left knee when cycling, 0/10 most of the time Pain location: the top of the left knee  Pain description: aching  Aggravating factors: laying on the right (not on the left), bending the knee, PT  Relieving factors: pain medication, rest   PRECAUTIONS: None  RED FLAGS: None   WEIGHT BEARING RESTRICTIONS: No  FALLS:  Has patient fallen in last 6 months? 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  Vitals: BP: 112/60  HR: 75   SpO2: 100  DIAGNOSTIC FINDINGS: None  PATIENT SURVEYS:  KOOS : 32%  COGNITION: Overall cognitive status: Within functional limits for tasks assessed     SENSATION: WFL  EDEMA:  Circumferential: R 17 in L 21 in  MUSCLE LENGTH: NT  POSTURE: No Significant postural limitations  PALPATION: No TTP   LOWER EXTREMITY ROM:  Active ROM Right eval Left eval  Hip flexion    Hip extension    Hip abduction    Hip adduction    Hip  internal rotation    Hip external rotation    Knee flexion 107/107 61/65  Knee extension 0  0  Ankle dorsiflexion    Ankle plantarflexion    Ankle inversion    Ankle eversion     (Blank rows = not tested)  LOWER EXTREMITY MMT:  MMT Right eval Left eval  Hip flexion 4 3+*  Hip extension    Hip abduction    Hip adduction    Hip internal rotation    Hip external rotation    Knee flexion 4+ 4+*  Knee extension 4+ 4-*  Ankle dorsiflexion    Ankle plantarflexion    Ankle inversion    Ankle eversion     (Blank rows = not tested)  FUNCTIONAL TESTS:  6 minute walk test: 740 ft  GAIT: Distance walked: to be assessed visit #2 Assistive device utilized: Single point cane Level of assistance: Modified independence Comments: decreased L knee flexion, increased stance time on RLE, decreased stance time on LLE, increased trunk lean to the right to clear left foot, increased knee valgus                                                                                                                               TREATMENT DATE: 02/20/24  There.ex:   L knee AROM: 0-86 degrees prior to session   L knee flexion on blue med ball: 2x15  Prone L knee flexion with bolster on knee for rec fem stretch and PT OP: 3x30 sec    There.Act:  TRX squat: x12 to chair for TC  LLE forward lunge at TM bar: 3x8. Good form/technique.   Standing RDL to 4" step + airex pad: 2x12. Initial PT demo and mod VC's for hip hinge. Excellent form/technique post cuing.    Standing L knee lunge on second step: x8    L knee flexion AROM post session: 95 degrees    02/11/24: Pt is donning TENS unit throughout entire session.  THEREX:  Nu-Step with seat at 7 for 5 min   SELF CARE HOME MANAGEMENT   Education about settings for TENS unit with pulse rate and pad position for pain modulation Review of home exercise plan and modifications to exercises   PHYSICAL PERFORMANCE  : 1,050 ft  -Decreased step length  bilaterally  Knee Ext R/L 4+/4+  Knee Flex AROM R 90 deg  02/07/24: THEREX  Matrix Recumbent bike with seat at 14 with half rotations 5 min Seated Left Knee Flexion AAROM 2 x 10   MANUAL  Tibofemural glides anterior to posterior glides grade III-IV with knee in 60 degrees flexion and patient in long sitting x 30  Movement with mobilization with posterior to anterior glides with knee flexion AROM on 6 inch step x 30   02/04/23: THEREX  Matrix Recumbent bike with seat at 14 with half rotations 5 min  TRX Squat into chair 2 x 10  -min VC to maintain heels close to chair seat   Left Knee Flexion on 2nd Step with BUE support and 2 sec hold 2 x 10  OMEGA Leg Extension with overpressure into knee flexion #15 2 x 10 with 2 sec hold   -NRPS 7/10 localized over left patella Seated Knee Flexion to 90 degrees on LLE  Supine Left Knee Flexion PROM 1 x 5  -Pt unable to perform due to increased anterior knee pain localized over middle portion of surgical scar  01/31/24: THEREX NU-Step seat at 7 with 2 resistance  Self Mobilizations for left knee flexion in long sitting X 30  -min VC for patient to provide downward and diagonal force Prone Quad Stretch of LLE  3 x 30 sec  Mini-Squat 2 x 10 with BUE support    MANUAL Posterior knee glide on Right knee grade IV mobs 3 x 30  Gentle left knee flexion and extension PROM  Supine left Hamstring Stretch 2 x 30 sec   01/29/24: THEREX  Nu-Step seat and arms at 9 with resistance at 3  for 5 min  R knee flex AROM 83 Fibularis longus stretch at wall 2 x 30 sec  Reviewed HEP with emphasis on knee flexion exercises  KOOS Score: 64%    Trigger Point Dry Needling  Initial Treatment: Pt instructed on Dry Needling rational, procedures, and possible side effects. Pt instructed to expect mild to moderate muscle soreness later in the day and/or into the next day.  Pt instructed in methods to reduce muscle soreness. Pt instructed to continue prescribed  HEP. Patient was educated on signs and symptoms of infection and other risk factors and advised to seek medical attention should they occur.  Patient verbalized understanding of these instructions and education.   Patient Verbal Consent Given: Yes Education Handout Provided: Yes Muscles Treated: Left fibularis longus  Electrical Stimulation Performed: No Treatment Response/Outcome: No twitches    01/23/24:  SELF CARE  TENS with inferential at 15 ma CC with box setup for 10 min  -Box setup with pads   -Pt reports about same amount of knee pain with knee flexion   THERAPEUTIC ACTIVITY  Mini-Squat with BUE support 1 x 10  Step Up on LLE on 4 inch step 1 x 10 with BUE support   Step Up on LLE on 6 inch step 1 x 10 with BUE support  Step Up on LLE on 12 inch step 1 x 10 with BUE support      PATIENT EDUCATION:  Education details: Form and technique for correct performance of exercise and explanation about exam findings Person educated: Patient Education method: Explanation and Verbal cues Education comprehension: verbalized understanding, returned demonstration, and verbal cues required  HOME EXERCISE PROGRAM: Access Code: [email or web address] URL: [email or web address] Date: 02/11/2024 Prepared by: Ellin Goodie  Exercises -  Seated Hamstring Stretch  - 1 x daily - 7 x weekly - 3 sets - 30 sec hold - Seated Knee Flexion AAROM  - 1 x daily - 2 sets - 10 reps - 3 sec   hold - Standing Knee Flexion Stretch on Step  - 1 x daily - 7 x weekly - 2 sets - 10 reps - 3 sec  hold - Seated Knee Flexion Stretch  - 1 x daily - 7 x weekly - 2 sets - 10 reps - 3 sec  hold - Forward Step Up with Counter Support  - 3-4 x weekly - 3 sets - 10 reps - Theatre manager with Chair and Counter Support  - 1 x daily - 7 x weekly - 3 reps - 30 sec  hold  Patient Education - Scar Massage  ASSESSMENT:  CLINICAL IMPRESSION: Pt returning s/p L TKA manipulation on 4/14. Prior to session pt at 86  degrees. Initiation of therex and theract working on knee flexion and global knee strengthening. Progressing single limb compound exercises to address flexion deficits and muscle weakness with success. Post knee flexion AROM is 95 degrees upon exit. Encouraged rest and regular HEP to promote flexion gains. Pt understanding. Pt will continue to benefit from skilled PT services to address flexion, gait and muscle weakness deficits to maximize return to PLOF.    OBJECTIVE IMPAIRMENTS: Abnormal gait, decreased activity tolerance, decreased balance, decreased endurance, decreased mobility, difficulty walking, decreased ROM, decreased strength, hypomobility, increased edema, and pain.   ACTIVITY LIMITATIONS: carrying, lifting, bending, sitting, standing, squatting, sleeping, stairs, transfers, bed mobility, and locomotion level  PARTICIPATION LIMITATIONS: cleaning, laundry, driving, community activity, and occupation  PERSONAL FACTORS: Fitness, Time since onset of injury/illness/exacerbation, and 3+ comorbidities: obesity, HTN, OA  are also affecting patient's functional outcome.   REHAB POTENTIAL: Good  CLINICAL DECISION MAKING: Stable/uncomplicated  EVALUATION COMPLEXITY: Low   GOALS: Goals reviewed with patient? No  SHORT TERM GOALS: Target date: 01/14/2024  Pt will be independent with HEP in order to improve strength and decrease pain in order to improve pain-free function at home and work.  Baseline: NT 01/16/24: Able to perform independently  Goal status: ACHIEVED   LONG TERM GOALS: Target date: 03/10/2024  Pt will decrease their KOOS score by at least 10 points to show significant increase in knee function.  Baseline: 32% 01/29/24 64%  Goal status: ACHIEVED   2.  Pt will increase their left knee flexion AROM to be within 10% of the right knee to show significant improvement of knee range of motion to return to activities that require squatting and bending.  Baseline: Knee flexion R/L  107/61 01/29/24: Knee Flex R 83 deg 02/11/24:  Knee Flexion L 90  Goal status: PARTIALLY MET    3.  Pt will increase their left hip flexion and knee extension strength by at least 1/3 MMT grade (4- to 4) to return to ambulating safely without an assistive device.  Baseline: Hip flexion R/L 4/3+*; Knee extension R/L 4+/4-* 02/11/24: Knee Ext R/L 4+/4+  Goal status: ACHIEVED   4.  Pt will increase by at least 70m (155ft) in order to demonstrate clinically significant improvement in community ambulation. Baseline: 740 ft  02/11/24: 1,040 ft  Goal status: ACHIEVED    PLAN:  PT FREQUENCY: 2x/week  PT DURATION: 10 weeks  PLANNED INTERVENTIONS: 97110-Therapeutic exercises, 97530- Therapeutic activity, 97112- Neuromuscular re-education, 97535- Self Care, 62130- Manual therapy, [phone]- Gait training, Patient/Family education, Balance training, Stair training, Joint  mobilization, Joint manipulation, Scar mobilization, Cryotherapy, Moist heat, and Biofeedback  PLAN FOR NEXT SESSION: progress flexion and single limb strengthening   Marc Senior. Fairly IV, PT, DPT Physical Therapist- [place]  [facility]

## 2024-02-25 ENCOUNTER — Ambulatory Visit: Payer: Self-pay | Admitting: Physical Therapy

## 2024-02-25 DIAGNOSIS — M25562 Pain in left knee: Secondary | ICD-10-CM

## 2024-02-25 NOTE — Therapy (Signed)
 OUTPATIENT PHYSICAL THERAPY TREATMENT    Patient Name: Tammy Wright MRN: [id number] DOB:February 23, 1966, 58 y.o., female Today's Date: 02/25/2024  END OF SESSION:  PT End of Session - 02/25/24 1522     Visit Number 12    Number of Visits 20    Date for PT Re-Evaluation 03/10/24    Authorization Type BCBS [id number]    Authorization Time Period VL 60/55 remain    Authorization - Visit Number 12    Authorization - Number of Visits 20    Progress Note Due on Visit 20    PT Start Time 1520    PT Stop Time 1600    PT Time Calculation (min) 40 min    Activity Tolerance Patient tolerated treatment well    Behavior During Therapy [facility] for tasks assessed/performed                  No past medical history on file. No past surgical history on file. 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   PERTINENT HISTORY: Pt is a 58 y/o female s/p left TKA on January 27th and was seen by home health PT. She also reports having some pain in her right knee as well. She reports having trouble with regaining flexion/bending the knee. Pt has neuropathy which causes knee to be aggravated around 3-4 am before taking the pain medication. She says her doctor says it is probably  due to the the nerve block. She reports the swelling has been bothering her and has not gone down. She reports that she moved to the [facility] a week ago from the 2WW which made her feel too hunched over and she is working on not using the cane either. Pt reports some numbness on anterior knee from the nerve block but no tingling. Pt says walking around has gone well, it has been not too painful and can usually walk about 15-20 minutes before having to sit down.  PAIN:  Are you having pain? Coming in without any pain today  Yes: NPRS scale: 7/10 worst when flexing left knee when cycling, 0/10 most of the time Pain location: the top of the left knee  Pain description: aching  Aggravating factors: laying on the right (not on the left), bending the knee, PT  Relieving factors: pain medication, rest   PRECAUTIONS: None  RED FLAGS: None   WEIGHT BEARING RESTRICTIONS: No  FALLS:  Has patient fallen in last 6 months? 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  Vitals: BP: 112/60  HR: 75   SpO2: 100  DIAGNOSTIC FINDINGS: None  PATIENT SURVEYS:  KOOS : 32%  COGNITION: Overall cognitive status: Within functional limits for tasks assessed     SENSATION: WFL  EDEMA:  Circumferential: R 17 in L 21 in  MUSCLE LENGTH: NT  POSTURE: No Significant postural limitations  PALPATION: No TTP   LOWER EXTREMITY ROM:  Active ROM Right eval Left eval  Hip flexion    Hip extension    Hip abduction    Hip adduction    Hip  internal rotation    Hip external rotation    Knee flexion 107/107 61/65  Knee extension 0  0  Ankle dorsiflexion    Ankle plantarflexion    Ankle inversion    Ankle eversion     (Blank rows = not tested)  LOWER EXTREMITY MMT:  MMT Right eval Left eval  Hip flexion 4 3+*  Hip extension    Hip abduction    Hip adduction    Hip internal rotation    Hip external rotation    Knee flexion 4+ 4+*  Knee extension 4+ 4-*  Ankle dorsiflexion    Ankle plantarflexion    Ankle inversion    Ankle eversion     (Blank rows = not tested)  FUNCTIONAL TESTS:  6 minute walk test: 740 ft  GAIT: Distance walked: to be assessed visit #2 Assistive device utilized: Single point cane Level of assistance: Modified independence Comments: decreased L knee flexion, increased stance time on RLE, decreased stance time on LLE, increased trunk lean to the right to clear left foot, increased knee valgus                                                                                                                               TREATMENT DATE:   02/25/24:  THEREX   Matrix recumbent bicycle with seat at 14 for 5 min TRX Squat with butt taps into 18 inch chair 1 x 10  TRX Squat with butt taps into 18 inch chair in staggered stance with RLE forward and BUE support  1 x 10     Seated Left Knee Flexion AROM with blue med ball 1 x 10  Seated Left Knee Flexion Scoots 1 x 10  Left knee Lunges on step 1 x 10  Supine left knee flexion hangs 1 x 10   MANUAL Prone  Left Quad Stretch 30 sec x 2   PNF Contract Relax with left quad 3 x 30 sec   Left knee flexion AROM 98     02/20/24  There.ex:   L knee AROM: 0-86 degrees prior to session   L knee flexion on blue med ball: 2x15  Prone L knee flexion with bolster on knee for  rec fem stretch and PT OP: 3x30 sec    There.Act:  TRX squat: x12 to chair for TC  LLE forward lunge at TM bar: 3x8. Good form/technique.   Standing RDL to 4" step + airex pad:  2x12. Initial PT demo and mod VC's for hip hinge. Excellent form/technique post cuing.    Standing L knee lunge on second step: x8    L knee flexion AROM post session: 95 degrees    02/11/24: Pt is donning TENS unit throughout entire session.  THEREX:  Nu-Step with seat at 7 for 5 min   SELF CARE HOME MANAGEMENT   Education about settings for TENS unit with pulse rate and pad position for pain modulation Review of home exercise plan and modifications to exercises   PHYSICAL PERFORMANCE   : 1,050 ft  -Decreased step length bilaterally  Knee Ext R/L 4+/4+  Knee Flex AROM R 90 deg  02/07/24: THEREX  Matrix Recumbent bike with seat at 14 with half rotations 5 min Seated Left Knee Flexion AAROM 2 x 10   MANUAL  Tibofemural glides anterior to posterior glides grade III-IV with knee in 60 degrees flexion and patient in long sitting x 30  Movement with mobilization with posterior to anterior glides with knee flexion AROM on 6 inch step x 30   02/04/23: THEREX  Matrix Recumbent bike with seat at 14 with half rotations 5 min  TRX Squat into chair 2 x 10  -min VC to maintain heels close to chair seat   Left Knee Flexion on 2nd Step with BUE support and 2 sec hold 2 x 10  OMEGA Leg Extension with overpressure into knee flexion #15 2 x 10 with 2 sec hold   -NRPS 7/10 localized over left patella Seated Knee Flexion to 90 degrees on LLE  Supine Left Knee Flexion PROM 1 x 5  -Pt unable to perform due to increased anterior knee pain localized over middle portion of surgical scar  01/31/24: THEREX NU-Step seat at 7 with 2 resistance  Self Mobilizations for left knee flexion in long sitting X 30  -min VC for patient to provide downward and diagonal force Prone Quad Stretch of LLE  3 x 30 sec  Mini-Squat 2 x 10 with BUE support    MANUAL Posterior knee glide on Right knee grade IV mobs 3 x 30  Gentle left knee flexion and extension PROM  Supine left Hamstring Stretch 2 x 30 sec     PATIENT EDUCATION:  Education details: Form and technique for correct performance of exercise and explanation about exam findings Person educated: Patient Education method: Explanation and Verbal cues Education comprehension: verbalized understanding, returned demonstration, and verbal cues required  HOME EXERCISE PROGRAM: Access Code: [email or web address] URL: [email or web address] Date: 02/11/2024 Prepared by: Marge Shed  Exercises - Seated Hamstring Stretch  - 1 x daily - 7 x weekly - 3 sets - 30 sec hold - Seated Knee Flexion AAROM  - 1 x daily - 2 sets - 10 reps - 3 sec   hold - Standing Knee Flexion Stretch on Step  - 1 x daily - 7 x weekly - 2 sets - 10 reps - 3 sec  hold - Seated Knee Flexion Stretch  - 1 x daily - 7 x weekly - 2 sets - 10 reps - 3 sec  hold - Forward Step Up with Counter Support  - 3-4 x weekly - 3 sets - 10 reps - Theatre manager with Chair and Counter  Support  - 1 x daily - 7 x weekly - 3 reps - 30 sec  hold  Patient Education - Scar Massage  ASSESSMENT:  CLINICAL IMPRESSION: Pt returning s/p 1 week left knee manipulation from restricted left knee following left TKA. She demonstrates ongoing left knee mobility restrictions. However, she does show improved knee flexion ROM with ability to reach 98 degrees. Pt continues to be limited by incision pain over left knee especially when reaching terminal knee flexion. Pt will continue to benefit from skilled PT services to address flexion, gait and muscle weakness deficits to maximize return to PLOF.     OBJECTIVE IMPAIRMENTS: Abnormal gait, decreased activity tolerance, decreased balance, decreased endurance, decreased mobility, difficulty walking, decreased ROM, decreased strength, hypomobility, increased edema, and pain.   ACTIVITY LIMITATIONS: carrying, lifting, bending, sitting, standing, squatting, sleeping, stairs, transfers, bed mobility, and locomotion level  PARTICIPATION LIMITATIONS:  cleaning, laundry, driving, community activity, and occupation  PERSONAL FACTORS: Fitness, Time since onset of injury/illness/exacerbation, and 3+ comorbidities: obesity, HTN, OA  are also affecting patient's functional outcome.   REHAB POTENTIAL: Good  CLINICAL DECISION MAKING: Stable/uncomplicated  EVALUATION COMPLEXITY: Low   GOALS: Goals reviewed with patient? No  SHORT TERM GOALS: Target date: 01/14/2024  Pt will be independent with HEP in order to improve strength and decrease pain in order to improve pain-free function at home and work.  Baseline: NT 01/16/24: Able to perform independently  Goal status: ACHIEVED   LONG TERM GOALS: Target date: 03/10/2024  Pt will decrease their KOOS score by at least 10 points to show significant increase in knee function.  Baseline: 32% 01/29/24 64%  Goal status: ACHIEVED   2.  Pt will increase their left knee flexion AROM to be within 10% of the right knee to show significant improvement of knee range of motion to return to activities that require squatting and bending.  Baseline: Knee flexion R/L 107/61 01/29/24: Knee Flex R 83 deg 02/11/24:  Knee Flexion L 90 02/25/24: Left knee Flex 98  Goal status: PARTIALLY MET    3.  Pt will increase their left hip flexion and knee extension strength by at least 1/3 MMT grade (4- to 4) to return to ambulating safely without an assistive device.  Baseline: Hip flexion R/L 4/3+*; Knee extension R/L 4+/4-* 02/11/24: Knee Ext R/L 4+/4+  Goal status: ACHIEVED   4.  Pt will increase by at least 39m (19ft) in order to demonstrate clinically significant improvement in community ambulation. Baseline: 740 ft  02/11/24: 1,040 ft  Goal status: ACHIEVED    PLAN:  PT FREQUENCY: 2x/week  PT DURATION: 10 weeks  PLANNED INTERVENTIONS: 97110-Therapeutic exercises, 97530- Therapeutic activity, 97112- Neuromuscular re-education, 97535- Self Care, 54098- Manual therapy, [phone]- Gait training, Patient/Family education,  Balance training, Stair training, Joint mobilization, Joint manipulation, Scar mobilization, Cryotherapy, Moist heat, and Biofeedback  PLAN FOR NEXT SESSION: Progress step ups and focus on improving knee flexion with supine knee flexion AROM with contract relax instead of prone   Marge Shed PT, DPT  [facility] [address] [place], [address] Phone: [phone]   Fax:  [phone]

## 2024-02-27 ENCOUNTER — Ambulatory Visit: Payer: Self-pay | Admitting: Physical Therapy

## 2024-02-27 DIAGNOSIS — M25562 Pain in left knee: Secondary | ICD-10-CM | POA: Diagnosis not present

## 2024-02-27 NOTE — Therapy (Signed)
 OUTPATIENT PHYSICAL THERAPY TREATMENT    Patient Name: Tammy Wright MRN: [id number] DOB:09-28-1966, 58 y.o., female Today's Date: 02/27/2024  END OF SESSION:  PT End of Session - 02/27/24 1523     Visit Number 13    Number of Visits 20    Date for PT Re-Evaluation 03/10/24    Authorization Type BCBS [id number]    Authorization Time Period VL 60/55 remain    Authorization - Visit Number 13    Authorization - Number of Visits 20    Progress Note Due on Visit 20    PT Start Time 1520    PT Stop Time 1600    PT Time Calculation (min) 40 min    Activity Tolerance Patient tolerated treatment well    Behavior During Therapy [facility] for tasks assessed/performed                  No past medical history on file. No past surgical history on file. 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   PERTINENT HISTORY: Pt is a 58 y/o female s/p left TKA on January 27th and was seen by home health PT. She also reports having some pain in her right knee as well. She reports having trouble with regaining flexion/bending the knee. Pt has neuropathy which causes knee to be aggravated around 3-4 am before taking the pain medication. She says her doctor says it  is probably due to the the nerve block. She reports the swelling has been bothering her and has not gone down. She reports that she moved to the [facility] a week ago from the 2WW which made her feel too hunched over and she is working on not using the cane either. Pt reports some numbness on anterior knee from the nerve block but no tingling. Pt says walking around has gone well, it has been not too painful and can usually walk about 15-20 minutes before having to sit down.  PAIN:  Are you having pain? Coming in without any pain today  Yes: NPRS scale: 7/10 worst when flexing left knee when cycling, 0/10 most of the time Pain location: the top of the left knee  Pain description: aching  Aggravating factors: laying on the right (not on the left), bending the knee, PT  Relieving factors: pain medication, rest   PRECAUTIONS: None  RED FLAGS: None   WEIGHT BEARING RESTRICTIONS: No  FALLS:  Has patient fallen in last 6 months? 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  Vitals: BP: 112/60  HR: 75   SpO2: 100  DIAGNOSTIC FINDINGS: None  PATIENT SURVEYS:  KOOS : 32%  COGNITION: Overall cognitive status: Within functional limits for tasks assessed     SENSATION: WFL  EDEMA:  Circumferential: R 17 in L 21 in  MUSCLE LENGTH: NT  POSTURE: No Significant postural limitations  PALPATION: No TTP   LOWER EXTREMITY ROM:  Active ROM Right eval Left eval  Hip flexion    Hip extension    Hip abduction    Hip adduction     Hip internal rotation    Hip external rotation    Knee flexion 107/107 61/65  Knee extension 0  0  Ankle dorsiflexion    Ankle plantarflexion    Ankle inversion    Ankle eversion     (Blank rows = not tested)  LOWER EXTREMITY MMT:  MMT Right eval Left eval  Hip flexion 4 3+*  Hip extension    Hip abduction    Hip adduction    Hip internal rotation    Hip external rotation    Knee flexion 4+ 4+*  Knee extension 4+ 4-*  Ankle dorsiflexion    Ankle plantarflexion    Ankle inversion    Ankle eversion     (Blank rows = not tested)  FUNCTIONAL TESTS:  6 minute walk test: 740 ft  GAIT: Distance walked: to be assessed visit #2 Assistive device utilized: Single point cane Level of assistance: Modified independence Comments: decreased L knee flexion, increased stance time on RLE, decreased stance time on LLE, increased trunk lean to the right to clear left foot, increased knee valgus                                                                                                                               TREATMENT DATE:   02/27/24:  THEREX   Matrix recumbent bicycle with seat at 14 for 5 min Left Knee Flexion AROM on 12 inch step with 3 sec hold at terminal flexion 1 x 10  Left Knee Step Up 6 inch step BUE 1 x 10  Left Knee Step 6 inch step 1 UE 1 x 10  Seated Left Knee Flexion AROM with blue med ball 1 x 10  Forward Lunges with 1 UE support and stepping with LLE 2 x 10  Matrix recumbent bicycle with seat at 12 for 5 min  MANUAL  Left knee- Anterior to posterior tibiofemoral glide grade III-IV  -Able to achieve 99 deg left knee flexion   02/25/24:  THEREX   Matrix recumbent bicycle with seat at 14 for 5 min TRX Squat with butt taps into 18 inch chair 1 x 10  TRX Squat with butt taps into 18 inch chair in  staggered stance with RLE forward and BUE support  1 x 10     Seated Left Knee Flexion AROM with blue med ball 1 x 10  Seated Left Knee Flexion Scoots 1 x 10   Left knee Lunges on step 1 x 10  Supine left knee flexion hangs 1 x 10   MANUAL Prone  Left Quad Stretch 30 sec x 2   PNF Contract Relax with left quad 3 x 30 sec   Left knee flexion AROM 98     02/20/24  There.ex:   L knee AROM: 0-86 degrees prior to session   L knee flexion on blue med ball: 2x15  Prone L knee flexion with bolster on knee for rec fem stretch and PT OP: 3x30 sec    There.Act:  TRX squat: x12 to chair for TC  LLE forward lunge at TM bar: 3x8. Good form/technique.   Standing RDL to 4" step + airex pad: 2x12. Initial PT demo and mod VC's for hip hinge. Excellent form/technique post cuing.    Standing L knee lunge on second step: x8    L knee flexion AROM post session: 95 degrees    02/11/24: Pt is donning TENS unit throughout entire session.  THEREX:  Nu-Step with seat at 7 for 5 min   SELF CARE HOME MANAGEMENT   Education about settings for TENS unit with pulse rate and pad position for pain modulation Review of home exercise plan and modifications to exercises   PHYSICAL PERFORMANCE   : 1,050 ft  -Decreased step length bilaterally  Knee Ext R/L 4+/4+  Knee Flex AROM R 90 deg  02/07/24: THEREX  Matrix Recumbent bike with seat at 14 with half rotations 5 min Seated Left Knee Flexion AAROM 2 x 10   MANUAL  Tibofemural glides anterior to posterior glides grade III-IV with knee in 60 degrees flexion and patient in long sitting x 30  Movement with mobilization with posterior to anterior glides with knee flexion AROM on 6 inch step x 30    PATIENT EDUCATION:  Education details: Form and technique for correct performance of exercise and explanation about exam findings Person educated: Patient Education method: Explanation and Verbal cues Education comprehension: verbalized understanding, returned demonstration, and verbal cues required  HOME EXERCISE PROGRAM: Access Code: [email or web address] URL: [email or web address] Date:  02/27/2024 Prepared by: Marge Shed  Exercises - Seated Hamstring Stretch  - 1 x daily - 7 x weekly - 3 sets - 30 sec hold - Supine Heel Slide with Strap  - 1 x daily - 7 x weekly - 2 sets - 10 reps - Seated Knee Flexion AAROM  - 1 x daily - 2 sets - 10 reps - 3 sec   hold - Standing Knee Flexion Stretch on Step  - 1 x daily - 7 x weekly - 2 sets - 10 reps - 3 sec  hold - Seated Knee Flexion Stretch  - 1 x daily - 7 x weekly - 2 sets - 10 reps - 3 sec  hold - Forward Step Up with Counter Support  - 3-4 x weekly - 3 sets - 10 reps - Theatre manager with Chair and Counter Support  - 1 x daily - 7 x weekly - 3 reps - 30 sec  hold - Supine Knee Flexion Wall Slide  - 1 x daily - 7 x weekly - 2 sets - 10 reps - Forward Lunge with Counter Support  - 3-4 x weekly - 3 sets -  10 reps  Patient Education - Scar Massage  ASSESSMENT:  CLINICAL IMPRESSION: Pt returning s/p 2 weeks left knee manipulation from restricted left knee following left TKA. She demonstrates an improvement in left knee mobility as well as a decrease in left incisional pain. She will continue to benefit from skilled PT services to address flexion, gait and muscle weakness deficits to maximize return to PLOF.      OBJECTIVE IMPAIRMENTS: Abnormal gait, decreased activity tolerance, decreased balance, decreased endurance, decreased mobility, difficulty walking, decreased ROM, decreased strength, hypomobility, increased edema, and pain.   ACTIVITY LIMITATIONS: carrying, lifting, bending, sitting, standing, squatting, sleeping, stairs, transfers, bed mobility, and locomotion level  PARTICIPATION LIMITATIONS: cleaning, laundry, driving, community activity, and occupation  PERSONAL FACTORS: Fitness, Time since onset of injury/illness/exacerbation, and 3+ comorbidities: obesity, HTN, OA  are also affecting patient's functional outcome.   REHAB POTENTIAL: Good  CLINICAL DECISION MAKING: Stable/uncomplicated  EVALUATION  COMPLEXITY: Low   GOALS: Goals reviewed with patient? No  SHORT TERM GOALS: Target date: 01/14/2024  Pt will be independent with HEP in order to improve strength and decrease pain in order to improve pain-free function at home and work.  Baseline: NT 01/16/24: Able to perform independently  Goal status: ACHIEVED   LONG TERM GOALS: Target date: 03/10/2024  Pt will decrease their KOOS score by at least 10 points to show significant increase in knee function.  Baseline: 32% 01/29/24 64%  Goal status: ACHIEVED   2.  Pt will increase their left knee flexion AROM to be within 10% of the right knee to show significant improvement of knee range of motion to return to activities that require squatting and bending.  Baseline: Knee flexion R/L 107/61 01/29/24: Knee Flex R 83 deg 02/11/24:  Knee Flexion L 90 02/25/24: Left knee Flex 98  Goal status: PARTIALLY MET    3.  Pt will increase their left hip flexion and knee extension strength by at least 1/3 MMT grade (4- to 4) to return to ambulating safely without an assistive device.  Baseline: Hip flexion R/L 4/3+*; Knee extension R/L 4+/4-* 02/11/24: Knee Ext R/L 4+/4+  Goal status: ACHIEVED   4.  Pt will increase by at least 27m (145ft) in order to demonstrate clinically significant improvement in community ambulation. Baseline: 740 ft  02/11/24: 1,040 ft  Goal status: ACHIEVED    PLAN:  PT FREQUENCY: 2x/week  PT DURATION: 10 weeks  PLANNED INTERVENTIONS: 97110-Therapeutic exercises, 97530- Therapeutic activity, 97112- Neuromuscular re-education, 97535- Self Care, 19147- Manual therapy, [phone]- Gait training, Patient/Family education, Balance training, Stair training, Joint mobilization, Joint manipulation, Scar mobilization, Cryotherapy, Moist heat, and Biofeedback  PLAN FOR NEXT SESSION: OMEGA Leg press and knee extension for strengthening and improved knee mobility. TRX squats and ongoing knee mobilization.   Marge Shed PT, DPT  [facility] [address] [place], [address] Phone: [phone]   Fax:  [phone]

## 2024-03-03 ENCOUNTER — Ambulatory Visit: Payer: Self-pay | Admitting: Physical Therapy

## 2024-03-03 DIAGNOSIS — M25562 Pain in left knee: Secondary | ICD-10-CM

## 2024-03-03 NOTE — Therapy (Signed)
 OUTPATIENT PHYSICAL THERAPY TREATMENT    Patient Name: Tammy Wright MRN: [id number] DOB:May 24, 1966, 58 y.o., female Today's Date: 03/03/2024  END OF SESSION:  PT End of Session - 03/03/24 1654     Visit Number 14    Number of Visits 20    Date for PT Re-Evaluation 03/10/24    Authorization Type [id number]    Authorization Time Period VL 60/55 remain    Authorization - Visit Number 14    Authorization - Number of Visits 20    Progress Note Due on Visit 20    PT Start Time 1515    PT Stop Time 1600    PT Time Calculation (min) 45 min    Activity Tolerance Patient tolerated treatment well    Behavior During Therapy [facility] for tasks assessed/performed                   No past medical history on file. No past surgical history on file. 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  Rationale for Evaluation and Treatment: Rehabilitation  ONSET DATE: January 27th  SUBJECTIVE:   SUBJECTIVE STATEMENT: Pt states that she was working her left knee a lot this weekend doing stair training with step over step.   PERTINENT HISTORY: Pt is a 58 y/o female s/p left TKA on January 27th and was seen by home health PT. She also reports having some pain in her right knee as well. She reports having trouble with regaining flexion/bending the knee. Pt has neuropathy which causes knee to be aggravated around 3-4 am before taking the pain medication. She says her doctor says it is probably due to the the nerve  block. She reports the swelling has been bothering her and has not gone down. She reports that she moved to the [facility] a week ago from the [place] which made her feel too hunched over and she is working on not using the cane either. Pt reports some numbness on anterior knee from the nerve block but no tingling. Pt says walking around has gone well, it has been not too painful and can usually walk about 15-20 minutes before having to sit down.  PAIN:  Are you having pain? Coming in without any pain today  Yes: NPRS scale: 7/10 worst when flexing left knee when cycling, 0/10 most of the time Pain location: the top of the left knee  Pain description: aching  Aggravating factors: laying on the right (not on the left), bending the knee, PT  Relieving factors: pain medication, rest   PRECAUTIONS: None  RED FLAGS: None   WEIGHT BEARING RESTRICTIONS: No  FALLS:  Has patient fallen in last 6 months? 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  Vitals: BP: 112/60  HR: 75   SpO2: 100  DIAGNOSTIC FINDINGS: None  PATIENT SURVEYS:  KOOS : 32%  COGNITION: Overall cognitive status: Within functional limits for tasks assessed     SENSATION: WFL  EDEMA:  Circumferential: R 17 in L 21 in  MUSCLE LENGTH: NT  POSTURE: No Significant postural limitations  PALPATION: No TTP   LOWER EXTREMITY ROM:  Active ROM Right eval Left eval  Hip flexion    Hip extension    Hip abduction    Hip adduction    Hip internal rotation    Hip  external rotation    Knee flexion 107/107 61/65  Knee extension 0  0  Ankle dorsiflexion    Ankle plantarflexion    Ankle inversion    Ankle eversion     (Blank rows = not tested)  LOWER EXTREMITY MMT:  MMT Right eval Left eval  Hip flexion 4 3+*  Hip extension    Hip abduction    Hip adduction    Hip internal rotation    Hip external rotation    Knee flexion 4+ 4+*  Knee extension 4+ 4-*  Ankle dorsiflexion    Ankle plantarflexion    Ankle inversion    Ankle eversion     (Blank rows = not tested)  FUNCTIONAL TESTS:  6 minute walk test: 740 ft  GAIT: Distance walked: to be assessed visit #2 Assistive device utilized: Single point cane Level of assistance: Modified independence Comments: decreased L knee flexion, increased stance time on RLE, decreased stance time on LLE, increased trunk lean to the right to clear left foot, increased knee valgus                                                                                                                               TREATMENT DATE:   03/03/24: All single leg exercises performed on LLE  THEREX   Matrix recumbent bicycle with seat at 14 for 5 min OMEGA Leg Press with seat at 1 #55 1 x 10  OMEGA Leg Press with seat at 2 #55 2 x 10  OMEGA Knee Ext with BLE with #20 3 x 10  OMEGA Knee Ex with BLE at #25 with 3 sec hold at terminal knee extension and to allow knee to flex to end range 2 x 10  Standing LLE Knee Flexion on 12 inch step with BUE support 2 x 10  Squats with staggered stance with LLE back and BUE support and tap on chair 2 x 10  Supine Knee Flexion LLE 102 degrees   02/27/24:  THEREX   Matrix recumbent bicycle with seat at 14 for 5 min Left Knee Flexion AROM on 12 inch step with 3 sec hold at terminal flexion 1 x 10  Left  Knee Step Up 6 inch step BUE 1 x 10  Left Knee Step 6 inch step 1 UE 1 x 10  Seated Left Knee Flexion AROM with blue med ball 1 x 10  Forward Lunges with 1 UE support and stepping  with LLE 2 x 10  Matrix recumbent bicycle with seat at 12 for 5 min  MANUAL  Left knee- Anterior to posterior tibiofemoral glide grade III-IV  -Able to achieve 99 deg left knee flexion   02/25/24:  THEREX   Matrix recumbent bicycle with seat at 14 for 5 min TRX Squat with butt taps into 18 inch chair 1 x 10  TRX Squat with butt taps into 18 inch chair in staggered stance with RLE forward and BUE support  1 x 10     Seated Left Knee Flexion AROM with blue med ball 1 x 10  Seated Left Knee Flexion Scoots 1 x 10  Left knee Lunges on step 1 x 10  Supine left knee flexion hangs 1 x 10   MANUAL Prone  Left Quad Stretch 30 sec x 2   PNF Contract Relax with left quad 3 x 30 sec   Left knee flexion AROM 98     02/20/24  There.ex:   L knee AROM: 0-86 degrees prior to session   L knee flexion on blue med ball: 2x15  Prone L knee flexion with bolster on knee for rec fem stretch and PT OP: 3x30 sec    There.Act:  TRX squat: x12 to chair for TC  LLE forward lunge at TM bar: 3x8. Good form/technique.   Standing RDL to 4" step + airex pad: 2x12. Initial PT demo and mod VC's for hip hinge. Excellent form/technique post cuing.    Standing L knee lunge on second step: x8    L knee flexion AROM post session: 95 degrees    02/11/24: Pt is donning TENS unit throughout entire session.  THEREX:  Nu-Step with seat at 7 for 5 min   SELF CARE HOME MANAGEMENT   Education about settings for TENS unit with pulse rate and pad position for pain modulation Review of home exercise plan and modifications to exercises   PHYSICAL PERFORMANCE   : 1,050 ft  -Decreased step length bilaterally  Knee Ext R/L 4+/4+  Knee Flex AROM R 90 deg  02/07/24: THEREX  Matrix Recumbent bike with seat at 14 with half rotations 5 min Seated Left Knee Flexion AAROM 2 x 10   MANUAL  Tibofemural glides anterior to posterior glides grade III-IV with knee in 60 degrees flexion and patient in long sitting x 30   Movement with mobilization with posterior to anterior glides with knee flexion AROM on 6 inch step x 30    PATIENT EDUCATION:  Education details: Form and technique for correct performance of exercise and explanation about exam findings Person educated: Patient Education method: Explanation and Verbal cues Education comprehension: verbalized understanding, returned demonstration, and verbal cues required  HOME EXERCISE PROGRAM: Access Code: [email or web address] URL: [email or web address] Date: 02/27/2024 Prepared by: Marge Shed  Exercises - Seated Hamstring Stretch  - 1 x daily - 7 x weekly - 3 sets - 30 sec hold - Supine Heel Slide with Strap  - 1 x daily - 7 x weekly - 2 sets - 10 reps - Seated Knee Flexion AAROM  - 1 x daily - 2 sets - 10 reps - 3 sec   hold - Standing Knee Flexion Stretch on Step  -  1 x daily - 7 x weekly - 2 sets - 10 reps - 3 sec  hold - Seated Knee Flexion Stretch  - 1 x daily - 7 x weekly - 2 sets - 10 reps - 3 sec  hold - Forward Step Up with Counter Support  - 3-4 x weekly - 3 sets - 10 reps - Theatre manager with Chair and Counter Support  - 1 x daily - 7 x weekly - 3 reps - 30 sec  hold - Supine Knee Flexion Wall Slide  - 1 x daily - 7 x weekly - 2 sets - 10 reps - Forward Lunge with Counter Support  - 3-4 x weekly - 3 sets - 10 reps  Patient Education - Scar Massage  ASSESSMENT:  CLINICAL IMPRESSION: Pt returning s/p 3 weeks left knee manipulation from restricted left knee following left TKA. Pt exhibits improved left knee mobility with highest AROM measurement of knee flexion to date. 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   OBJECTIVE IMPAIRMENTS: Abnormal gait, decreased activity tolerance, decreased balance, decreased endurance, decreased mobility, difficulty walking, decreased ROM, decreased strength,  hypomobility, increased edema, and pain.   ACTIVITY LIMITATIONS: carrying, lifting, bending, sitting, standing, squatting, sleeping, stairs, transfers, bed mobility, and locomotion level  PARTICIPATION LIMITATIONS: cleaning, laundry, driving, community activity, and occupation  PERSONAL FACTORS: Fitness, Time since onset of injury/illness/exacerbation, and 3+ comorbidities: obesity, HTN, OA  are also affecting patient's functional outcome.   REHAB POTENTIAL: Good  CLINICAL DECISION MAKING: Stable/uncomplicated  EVALUATION COMPLEXITY: Low   GOALS: Goals reviewed with patient? No  SHORT TERM GOALS: Target date: 01/14/2024  Pt will be independent with HEP in order to improve strength and decrease pain in order to improve pain-free function at home and work.  Baseline: NT 01/16/24: Able to perform independently  Goal status: ACHIEVED   LONG TERM GOALS: Target date: 03/10/2024  Pt will decrease their KOOS score by at least 10 points to show significant increase in knee function.  Baseline: 32% 01/29/24 64%  Goal status: ACHIEVED   2.  Pt will increase their left knee flexion AROM to be within 10% of the right knee to show significant improvement of knee range of motion to return to activities that require squatting and bending.  Baseline: Knee flexion R/L 107/61 01/29/24: Knee Flex R 83 deg 02/11/24:  Knee Flexion L 90 02/25/24: Left knee Flex 98  Goal status: PARTIALLY MET    3.  Pt will increase their left hip flexion and knee extension strength by at least 1/3 MMT grade (4- to 4) to return to ambulating safely without an assistive device.  Baseline: Hip flexion R/L 4/3+*; Knee extension R/L 4+/4-* 02/11/24: Knee Ext R/L 4+/4+  Goal status: ACHIEVED   4.  Pt will increase by at least 56m (124ft) in order to demonstrate clinically significant improvement in community ambulation. Baseline: 740 ft  02/11/24: 1,040 ft  Goal status: ACHIEVED    PLAN:  PT FREQUENCY: 2x/week  PT  DURATION: 10 weeks  PLANNED INTERVENTIONS: 97110-Therapeutic exercises, 97530- Therapeutic activity, 97112- Neuromuscular re-education, 97535- Self Care, [address]- Manual therapy, [phone]- Gait training, Patient/Family education, Balance training, Stair training, Joint mobilization, Joint manipulation, Scar mobilization, Cryotherapy, Moist heat, and Biofeedback  PLAN FOR NEXT SESSION: OMEGA Leg press and knee extension for strengthening and improved knee mobility. Lunges and  and TRX squats and knee flexion scoots.   Marge Shed PT, DPT  [facility] [address] [place], [address] Phone: [phone]   Fax:  [phone]

## 2024-03-05 ENCOUNTER — Ambulatory Visit: Payer: Self-pay | Admitting: Physical Therapy

## 2024-03-05 DIAGNOSIS — M25562 Pain in left knee: Secondary | ICD-10-CM | POA: Diagnosis not present

## 2024-03-05 NOTE — Therapy (Addendum)
 OUTPATIENT PHYSICAL THERAPY TREATMENT    Patient Name: Tammy Wright MRN: [id number] DOB:Sep 21, 1966, 58 y.o., female Today's Date: 03/05/2024  END OF SESSION:  PT End of Session - 03/05/24 1522     Visit Number 15    Number of Visits 20    Date for PT Re-Evaluation 03/10/24    Authorization Type [id number]    Authorization Time Period VL 60/55 remain    Authorization - Visit Number 15    Authorization - Number of Visits 20    Progress Note Due on Visit 20    PT Start Time 1520    PT Stop Time 1600    PT Time Calculation (min) 40 min    Activity Tolerance Patient tolerated treatment well    Behavior During Therapy [facility] for tasks assessed/performed                    No past medical history on file. No past surgical history on file. 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  Rationale for Evaluation and Treatment: Rehabilitation  ONSET DATE: January 27th  SUBJECTIVE:   SUBJECTIVE STATEMENT: Pt reports that exercises continue to go ok and that she feels some stiffness but otherwise everything is ok.   PERTINENT HISTORY: Pt is a 58 y/o female s/p left TKA on January 27th and was seen by home health PT. She also reports having some pain in her right knee as well. She reports having trouble with regaining flexion/bending the knee. Pt has neuropathy which causes knee to be aggravated around 3-4 am before taking the pain medication. She says her doctor says it is probably due to the the  nerve block. She reports the swelling has been bothering her and has not gone down. She reports that she moved to the [facility] a week ago from the [place] which made her feel too hunched over and she is working on not using the cane either. Pt reports some numbness on anterior knee from the nerve block but no tingling. Pt says walking around has gone well, it has been not too painful and can usually walk about 15-20 minutes before having to sit down.  PAIN:  Are you having pain? Coming in without any pain today  Yes: NPRS scale: 7/10 worst when flexing left knee when cycling, 0/10 most of the time Pain location: the top of the left knee  Pain description: aching  Aggravating factors: laying on the right (not on the left), bending the knee, PT  Relieving factors: pain medication, rest   PRECAUTIONS: None  RED FLAGS: None   WEIGHT BEARING RESTRICTIONS: No  FALLS:  Has patient fallen in last 6 months? 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  Vitals: BP: 112/60  HR: 75   SpO2: 100  DIAGNOSTIC FINDINGS: None  PATIENT SURVEYS:  KOOS : 32%  COGNITION: Overall cognitive status: Within functional limits for tasks assessed     SENSATION: WFL  EDEMA:  Circumferential: R 17 in L 21 in  MUSCLE LENGTH: NT  POSTURE: No Significant postural limitations  PALPATION: No TTP   LOWER EXTREMITY ROM:  Active ROM Right eval Left eval  Hip flexion    Hip extension    Hip abduction    Hip adduction    Hip internal rotation     Hip external rotation    Knee flexion 107/107 61/65  Knee extension 0  0  Ankle dorsiflexion    Ankle plantarflexion    Ankle inversion    Ankle eversion     (Blank rows = not tested)  LOWER EXTREMITY MMT:  MMT Right eval Left eval  Hip flexion 4 3+*  Hip extension    Hip abduction    Hip adduction    Hip internal rotation    Hip external rotation    Knee flexion 4+ 4+*  Knee extension 4+ 4-*  Ankle dorsiflexion    Ankle plantarflexion    Ankle inversion    Ankle eversion     (Blank rows = not tested)  FUNCTIONAL TESTS:  6 minute walk test: 740 ft  GAIT: Distance walked: to be assessed visit #2 Assistive device utilized: Single point cane Level of assistance: Modified independence Comments: decreased L knee flexion, increased stance time on RLE, decreased stance time on LLE, increased trunk lean to the right to clear left foot, increased knee valgus                                                                                                                               TREATMENT DATE:   03/05/24: All single leg exercises performed on LLE   THEREX   Matrix recumbent bicycle with seat at 14 for 3 min Knee Scoot RLE  4 x 30 sec Runner Step Ups on RLE on 6 inch step 2 x 10   1 ft Step Ups on RLE using 1 UE support 2 x 10  Side Step Down on 6 inch step with 1 UE support on RLE 2 x 10  -Able to control eccentric portion of exercise.   PHYSICAL PERFORMANCE  Knee Flex AROM R 100 deg   Step Up on 1 foot step on LLE 1 x 10      03/03/24: All single leg exercises performed on LLE  THEREX   Matrix recumbent bicycle with seat at 14 for 5 min OMEGA Leg Press with seat at 1 #55 1 x 10  OMEGA Leg Press with seat at 2 #55 2 x 10  OMEGA Knee Ext  with BLE with #20 3 x 10  OMEGA Knee Ex with BLE at #25 with 3 sec hold at terminal knee extension and to allow knee to flex to end range 2 x 10  Standing LLE Knee Flexion on 12 inch step with BUE support 2 x 10  Squats with  staggered stance with LLE back and BUE support and tap on chair 2 x 10  Supine Knee Flexion LLE 102 degrees   02/27/24:  THEREX   Matrix recumbent bicycle with seat at 14 for 5 min Left Knee Flexion AROM on 12 inch step with 3 sec hold at terminal flexion 1 x 10  Left Knee Step Up 6 inch step BUE 1 x 10  Left Knee Step 6 inch step 1 UE 1 x 10  Seated Left Knee Flexion AROM with blue med ball 1 x 10  Forward Lunges with 1 UE support and stepping with LLE 2 x 10  Matrix recumbent bicycle with seat at 12 for 5 min  MANUAL  Left knee- Anterior to posterior tibiofemoral glide grade III-IV  -Able to achieve 99 deg left knee flexion   02/25/24:  THEREX   Matrix recumbent bicycle with seat at 14 for 5 min TRX Squat with butt taps into 18 inch chair 1 x 10  TRX Squat with butt taps into 18 inch chair in staggered stance with RLE forward and BUE support  1 x 10     Seated Left Knee Flexion AROM with blue med ball 1 x 10  Seated Left Knee Flexion Scoots 1 x 10  Left knee Lunges on step 1 x 10  Supine left knee flexion hangs 1 x 10   MANUAL Prone  Left Quad Stretch 30 sec x 2   PNF Contract Relax with left quad 3 x 30 sec   Left knee flexion AROM 98     02/20/24  There.ex:   L knee AROM: 0-86 degrees prior to session   L knee flexion on blue med ball: 2x15  Prone L knee flexion with bolster on knee for rec fem stretch and PT OP: 3x30 sec    There.Act:  TRX squat: x12 to chair for TC  LLE forward lunge at TM bar: 3x8. Good form/technique.   Standing RDL to 4" step + airex pad: 2x12. Initial PT demo and mod VC's for hip hinge. Excellent form/technique post cuing.    Standing L knee lunge on second step: x8    L knee flexion AROM post session: 95 degrees      PATIENT EDUCATION:  Education details: Form and technique for correct performance of exercise and explanation about exam findings Person educated: Patient Education method: Explanation and Verbal cues Education  comprehension: verbalized understanding, returned demonstration, and verbal cues required  HOME EXERCISE PROGRAM: Access Code: [email or web address] URL: [email or web address] Date: 03/05/2024 Prepared by: Marge Shed  Exercises - Seated Hamstring Stretch  - 1 x daily - 7 x weekly - 3 sets - 30 sec hold - Supine Heel Slide with Strap  - 1 x daily - 7 x weekly - 2 sets - 10 reps - Standing Knee Flexion Stretch on Step  - 1 x daily - 7 x weekly - 2 sets - 10 reps - 3 sec  hold - Supine Knee Flexion Wall Slide  - 1 x daily - 7 x weekly - 2 sets - 10 reps - Runner's Step Up/Down  - 3-4 x weekly - 3 sets - 10 reps - Side Step  Down with Counter Support  - 3-4 x weekly - 3 sets - 10 reps   ASSESSMENT:  CLINICAL IMPRESSION: Pt returning s/p 3 weeks left knee manipulation from restricted left knee following left TKA. She consistently has improved right knee mobility and strength. She has now been consistently at 100 degrees knee flexion and she was able to perform 1 ft step ups using UE support, mimicking functional task of getting into her truck. She is nearing the end of her plan of care and she likely has one week remaining before being discharged. She will continue to benefit from PT to be able to develop final home exercise plan and consistent home exercise plan to maintain right knee mobility and strength to continue to perform job related duties of climbing into and out of truck.    OBJECTIVE IMPAIRMENTS: Abnormal gait, decreased activity tolerance, decreased balance, decreased endurance, decreased mobility, difficulty walking, decreased ROM, decreased strength, hypomobility, increased edema, and pain.   ACTIVITY LIMITATIONS: carrying, lifting, bending, sitting, standing, squatting, sleeping, stairs, transfers, bed mobility, and locomotion level  PARTICIPATION LIMITATIONS: cleaning, laundry, driving, community activity, and occupation  PERSONAL FACTORS: Fitness, Time since onset of  injury/illness/exacerbation, and 3+ comorbidities: obesity, HTN, OA  are also affecting patient's functional outcome.   REHAB POTENTIAL: Good  CLINICAL DECISION MAKING: Stable/uncomplicated  EVALUATION COMPLEXITY: Low   GOALS: Goals reviewed with patient? No  SHORT TERM GOALS: Target date: 01/14/2024  Pt will be independent with HEP in order to improve strength and decrease pain in order to improve pain-free function at home and work.  Baseline: NT 01/16/24: Able to perform independently  Goal status: ACHIEVED   LONG TERM GOALS: Target date: 03/10/2024  Pt will decrease their KOOS score by at least 10 points to show significant increase in knee function.  Baseline: 32% 01/29/24 64%  Goal status: ACHIEVED   2.  Pt will increase their left knee flexion AROM to be within 10% of the right knee to show significant improvement of knee range of motion to return to activities that require squatting and bending.  Baseline: Knee flexion R/L 107/61 01/29/24: Knee Flex R 83 deg 02/11/24:  Knee Flexion L 90 02/25/24: Left knee Flex 98 03/05/24: Knee Flex R AROM 100 deg  Goal status: PARTIALLY MET    3.  Pt will increase their left hip flexion and knee extension strength by at least 1/3 MMT grade (4- to 4) to return to ambulating safely without an assistive device.  Baseline: Hip flexion R/L 4/3+*; Knee extension R/L 4+/4-* 02/11/24: Knee Ext R/L 4+/4+  Goal status: ACHIEVED   4.  Pt will increase by at least 51m (110ft) in order to demonstrate clinically significant improvement in community ambulation. Baseline: 740 ft  02/11/24: 1,040 ft  Goal status: ACHIEVED    PLAN:  PT FREQUENCY: 2x/week  PT DURATION: 10 weeks  PLANNED INTERVENTIONS: 97110-Therapeutic exercises, 97530- Therapeutic activity, 97112- Neuromuscular re-education, 97535- Self Care, 16109- Manual therapy, [phone]- Gait training, Patient/Family education, Balance training, Stair training, Joint mobilization, Joint manipulation, Scar  mobilization, Cryotherapy, Moist heat, and Biofeedback  PLAN FOR NEXT SESSION: Do Recertification. Practice cat walk using blue ladder for steps. OMEGA Leg press. Modified thomas stretch with combined quad stretch,   Marge Shed PT, DPT  [facility] [address], [place], [address] Phone: [phone]   Fax:  [phone]

## 2024-03-10 ENCOUNTER — Ambulatory Visit: Payer: Self-pay | Admitting: Physical Therapy

## 2024-03-12 ENCOUNTER — Ambulatory Visit: Payer: Self-pay | Admitting: Physical Therapy

## 2024-03-17 ENCOUNTER — Ambulatory Visit: Payer: Self-pay | Admitting: Physical Therapy

## 2024-03-19 ENCOUNTER — Ambulatory Visit: Payer: Self-pay | Admitting: Physical Therapy

## 2024-03-24 ENCOUNTER — Encounter: Payer: Self-pay | Admitting: Physical Therapy

## 2024-03-26 ENCOUNTER — Encounter: Payer: Self-pay | Admitting: Physical Therapy

## 2024-04-02 ENCOUNTER — Encounter: Admitting: Physical Therapy

## 2024-04-07 ENCOUNTER — Encounter: Admitting: Physical Therapy

## 2024-04-09 ENCOUNTER — Encounter: Admitting: Physical Therapy

## 2024-04-14 ENCOUNTER — Encounter: Admitting: Physical Therapy

## 2024-04-16 ENCOUNTER — Encounter: Admitting: Physical Therapy

## 2024-04-21 ENCOUNTER — Encounter: Admitting: Physical Therapy

## 2024-04-23 ENCOUNTER — Encounter: Admitting: Physical Therapy

## 2024-12-16 ENCOUNTER — Ambulatory Visit: Admitting: Podiatry

## 2024-12-18 ENCOUNTER — Ambulatory Visit: Admitting: Podiatry
